# Patient Record
Sex: Male | Born: 1985
Health system: Southern US, Community
[De-identification: ages and names within clinical notes are randomized; demographics above are authoritative.]

## PROBLEM LIST (undated history)

## (undated) DIAGNOSIS — J45909 Unspecified asthma, uncomplicated: Secondary | ICD-10-CM

---

## 2019-07-13 ENCOUNTER — Inpatient Hospital Stay (HOSPITAL_COMMUNITY): Payer: No Typology Code available for payment source

## 2019-07-13 ENCOUNTER — Emergency Department (HOSPITAL_COMMUNITY): Payer: No Typology Code available for payment source

## 2019-07-13 ENCOUNTER — Encounter (HOSPITAL_COMMUNITY): Payer: Self-pay

## 2019-07-13 ENCOUNTER — Inpatient Hospital Stay (HOSPITAL_COMMUNITY)
Admission: EM | Admit: 2019-07-13 | Discharge: 2019-07-25 | DRG: 956 | Disposition: A | Discharge status: Rehabilitation Facility | Payer: No Typology Code available for payment source | Attending: Physician Assistant | Admitting: Physician Assistant

## 2019-07-13 ENCOUNTER — Encounter (HOSPITAL_COMMUNITY): Admission: EM | Disposition: A | Discharge status: Rehabilitation Facility | Payer: Self-pay | Source: Home / Self Care

## 2019-07-13 ENCOUNTER — Inpatient Hospital Stay (HOSPITAL_COMMUNITY): Payer: No Typology Code available for payment source | Admitting: Certified Registered Nurse Anesthetist

## 2019-07-13 DIAGNOSIS — S83104A Unspecified dislocation of right knee, initial encounter: Secondary | ICD-10-CM | POA: Diagnosis present

## 2019-07-13 DIAGNOSIS — S2232XA Fracture of one rib, left side, initial encounter for closed fracture: Secondary | ICD-10-CM | POA: Diagnosis present

## 2019-07-13 DIAGNOSIS — Z23 Encounter for immunization: Secondary | ICD-10-CM

## 2019-07-13 DIAGNOSIS — Z886 Allergy status to analgesic agent status: Secondary | ICD-10-CM | POA: Diagnosis not present

## 2019-07-13 DIAGNOSIS — R109 Unspecified abdominal pain: Secondary | ICD-10-CM | POA: Diagnosis not present

## 2019-07-13 DIAGNOSIS — Z20822 Contact with and (suspected) exposure to covid-19: Secondary | ICD-10-CM | POA: Diagnosis present

## 2019-07-13 DIAGNOSIS — G4700 Insomnia, unspecified: Secondary | ICD-10-CM | POA: Diagnosis present

## 2019-07-13 DIAGNOSIS — S27329A Contusion of lung, unspecified, initial encounter: Secondary | ICD-10-CM

## 2019-07-13 DIAGNOSIS — S32402A Unspecified fracture of left acetabulum, initial encounter for closed fracture: Secondary | ICD-10-CM | POA: Diagnosis present

## 2019-07-13 DIAGNOSIS — K5909 Other constipation: Secondary | ICD-10-CM | POA: Diagnosis not present

## 2019-07-13 DIAGNOSIS — R609 Edema, unspecified: Secondary | ICD-10-CM | POA: Diagnosis not present

## 2019-07-13 DIAGNOSIS — M238X1 Other internal derangements of right knee: Secondary | ICD-10-CM | POA: Diagnosis present

## 2019-07-13 DIAGNOSIS — Z91012 Allergy to eggs: Secondary | ICD-10-CM

## 2019-07-13 DIAGNOSIS — S82142A Displaced bicondylar fracture of left tibia, initial encounter for closed fracture: Secondary | ICD-10-CM | POA: Diagnosis present

## 2019-07-13 DIAGNOSIS — S52021B Displaced fracture of olecranon process without intraarticular extension of right ulna, initial encounter for open fracture type I or II: Secondary | ICD-10-CM | POA: Diagnosis present

## 2019-07-13 DIAGNOSIS — S83511A Sprain of anterior cruciate ligament of right knee, initial encounter: Secondary | ICD-10-CM | POA: Diagnosis present

## 2019-07-13 DIAGNOSIS — S72012A Unspecified intracapsular fracture of left femur, initial encounter for closed fracture: Secondary | ICD-10-CM | POA: Diagnosis present

## 2019-07-13 DIAGNOSIS — S8412XA Injury of peroneal nerve at lower leg level, left leg, initial encounter: Secondary | ICD-10-CM | POA: Diagnosis present

## 2019-07-13 DIAGNOSIS — T148XXA Other injury of unspecified body region, initial encounter: Secondary | ICD-10-CM

## 2019-07-13 DIAGNOSIS — D62 Acute posthemorrhagic anemia: Secondary | ICD-10-CM | POA: Diagnosis present

## 2019-07-13 DIAGNOSIS — Z419 Encounter for procedure for purposes other than remedying health state, unspecified: Secondary | ICD-10-CM

## 2019-07-13 DIAGNOSIS — T1490XA Injury, unspecified, initial encounter: Secondary | ICD-10-CM | POA: Diagnosis present

## 2019-07-13 DIAGNOSIS — Y92410 Unspecified street and highway as the place of occurrence of the external cause: Secondary | ICD-10-CM | POA: Diagnosis not present

## 2019-07-13 DIAGNOSIS — J45909 Unspecified asthma, uncomplicated: Secondary | ICD-10-CM | POA: Diagnosis present

## 2019-07-13 DIAGNOSIS — Y906 Blood alcohol level of 120-199 mg/100 ml: Secondary | ICD-10-CM | POA: Diagnosis present

## 2019-07-13 DIAGNOSIS — Z888 Allergy status to other drugs, medicaments and biological substances status: Secondary | ICD-10-CM

## 2019-07-13 DIAGNOSIS — R7303 Prediabetes: Secondary | ICD-10-CM | POA: Diagnosis present

## 2019-07-13 DIAGNOSIS — S022XXA Fracture of nasal bones, initial encounter for closed fracture: Secondary | ICD-10-CM | POA: Diagnosis present

## 2019-07-13 DIAGNOSIS — R739 Hyperglycemia, unspecified: Secondary | ICD-10-CM | POA: Diagnosis present

## 2019-07-13 DIAGNOSIS — F10129 Alcohol abuse with intoxication, unspecified: Secondary | ICD-10-CM | POA: Diagnosis present

## 2019-07-13 DIAGNOSIS — S27321A Contusion of lung, unilateral, initial encounter: Secondary | ICD-10-CM | POA: Diagnosis present

## 2019-07-13 DIAGNOSIS — S72302C Unspecified fracture of shaft of left femur, initial encounter for open fracture type IIIA, IIIB, or IIIC: Secondary | ICD-10-CM | POA: Diagnosis present

## 2019-07-13 DIAGNOSIS — R4182 Altered mental status, unspecified: Secondary | ICD-10-CM | POA: Diagnosis present

## 2019-07-13 DIAGNOSIS — R7401 Elevation of levels of liver transaminase levels: Secondary | ICD-10-CM | POA: Diagnosis present

## 2019-07-13 DIAGNOSIS — Z4659 Encounter for fitting and adjustment of other gastrointestinal appliance and device: Secondary | ICD-10-CM

## 2019-07-13 DIAGNOSIS — F17290 Nicotine dependence, other tobacco product, uncomplicated: Secondary | ICD-10-CM | POA: Diagnosis present

## 2019-07-13 HISTORY — PX: FEMUR IM NAIL: SHX1597

## 2019-07-13 HISTORY — DX: Unspecified asthma, uncomplicated: J45.909

## 2019-07-13 HISTORY — PX: PERCUTANEOUS PINNING: SHX2209

## 2019-07-13 HISTORY — PX: I & D EXTREMITY: SHX5045

## 2019-07-13 HISTORY — PX: ORIF ELBOW FRACTURE: SHX5031

## 2019-07-13 LAB — HEMOGLOBIN A1C
Hgb A1c MFr Bld: 5.9 % — ABNORMAL HIGH (ref 4.8–5.6)
Mean Plasma Glucose: 122.63 mg/dL

## 2019-07-13 LAB — COMPREHENSIVE METABOLIC PANEL
ALT: 151 U/L — ABNORMAL HIGH (ref 0–44)
AST: 218 U/L — ABNORMAL HIGH (ref 15–41)
Albumin: 4.1 g/dL (ref 3.5–5.0)
Alkaline Phosphatase: 55 U/L (ref 38–126)
Anion gap: 15 (ref 5–15)
BUN: 11 mg/dL (ref 6–20)
CO2: 20 mmol/L — ABNORMAL LOW (ref 22–32)
Calcium: 9 mg/dL (ref 8.9–10.3)
Chloride: 106 mmol/L (ref 98–111)
Creatinine, Ser: 1.37 mg/dL — ABNORMAL HIGH (ref 0.61–1.24)
GFR calc Af Amer: >60 mL/min (ref >60)
GFR calc non Af Amer: >60 mL/min (ref >60)
Glucose, Bld: 189 mg/dL — ABNORMAL HIGH (ref 70–99)
Potassium: 3.2 mmol/L — ABNORMAL LOW (ref 3.5–5.1)
Sodium: 141 mmol/L (ref 135–145)
Total Bilirubin: 0.3 mg/dL (ref 0.3–1.2)
Total Protein: 7.1 g/dL (ref 6.5–8.1)

## 2019-07-13 LAB — CBC
HCT: 44.9 % (ref 39.0–52.0)
HCT: 39.9 % (ref 39.0–52.0)
HCT: 27.2 % — ABNORMAL LOW (ref 39.0–52.0)
Hemoglobin: 15.1 g/dL (ref 13.0–17.0)
Hemoglobin: 12.8 g/dL — ABNORMAL LOW (ref 13.0–17.0)
Hemoglobin: 9.1 g/dL — ABNORMAL LOW (ref 13.0–17.0)
MCH: 31.3 pg (ref 26.0–34.0)
MCH: 30.5 pg (ref 26.0–34.0)
MCH: 31.1 pg (ref 26.0–34.0)
MCHC: 33.6 g/dL (ref 30.0–36.0)
MCHC: 32.1 g/dL (ref 30.0–36.0)
MCHC: 33.5 g/dL (ref 30.0–36.0)
MCV: 93 fL (ref 80.0–100.0)
MCV: 95.2 fL (ref 80.0–100.0)
MCV: 92.8 fL (ref 80.0–100.0)
Platelets: 296 10*3/uL (ref 150–400)
Platelets: 224 10*3/uL (ref 150–400)
Platelets: 173 10*3/uL (ref 150–400)
RBC: 4.83 MIL/uL (ref 4.22–5.81)
RBC: 4.19 MIL/uL — ABNORMAL LOW (ref 4.22–5.81)
RBC: 2.93 MIL/uL — ABNORMAL LOW (ref 4.22–5.81)
RDW: 12.1 % (ref 11.5–15.5)
RDW: 12.4 % (ref 11.5–15.5)
RDW: 12.5 % (ref 11.5–15.5)
WBC: 12.1 10*3/uL — ABNORMAL HIGH (ref 4.0–10.5)
WBC: 19.7 10*3/uL — ABNORMAL HIGH (ref 4.0–10.5)
WBC: 9.2 10*3/uL (ref 4.0–10.5)
nRBC: 0 % (ref 0.0–0.2)
nRBC: 0 % (ref 0.0–0.2)
nRBC: 0 % (ref 0.0–0.2)

## 2019-07-13 LAB — RESPIRATORY PANEL BY RT PCR (FLU A&B, COVID)
Influenza A by PCR: NEGATIVE
Influenza B by PCR: NEGATIVE
SARS Coronavirus 2 by RT PCR: NEGATIVE

## 2019-07-13 LAB — POCT I-STAT, CHEM 8
BUN: 11 mg/dL (ref 6–20)
Calcium, Ion: 1.05 mmol/L — ABNORMAL LOW (ref 1.15–1.40)
Chloride: 107 mmol/L (ref 98–111)
Creatinine, Ser: 0.8 mg/dL (ref 0.61–1.24)
Glucose, Bld: 159 mg/dL — ABNORMAL HIGH (ref 70–99)
HCT: 25 % — ABNORMAL LOW (ref 39.0–52.0)
Hemoglobin: 8.5 g/dL — ABNORMAL LOW (ref 13.0–17.0)
Potassium: 4.7 mmol/L (ref 3.5–5.1)
Sodium: 142 mmol/L (ref 135–145)
TCO2: 27 mmol/L (ref 22–32)

## 2019-07-13 LAB — POCT I-STAT 7, (LYTES, BLD GAS, ICA,H+H)
Acid-Base Excess: 0 mmol/L (ref 0.0–2.0)
Acid-Base Excess: 1 mmol/L (ref 0.0–2.0)
Acid-base deficit: 7 mmol/L — ABNORMAL HIGH (ref 0.0–2.0)
Bicarbonate: 20.3 mmol/L (ref 20.0–28.0)
Bicarbonate: 27.3 mmol/L (ref 20.0–28.0)
Bicarbonate: 27.1 mmol/L (ref 20.0–28.0)
Calcium, Ion: 1.15 mmol/L (ref 1.15–1.40)
Calcium, Ion: 1.12 mmol/L — ABNORMAL LOW (ref 1.15–1.40)
Calcium, Ion: 1.01 mmol/L — ABNORMAL LOW (ref 1.15–1.40)
HCT: 35 % — ABNORMAL LOW (ref 39.0–52.0)
HCT: 30 % — ABNORMAL LOW (ref 39.0–52.0)
HCT: 27 % — ABNORMAL LOW (ref 39.0–52.0)
Hemoglobin: 11.9 g/dL — ABNORMAL LOW (ref 13.0–17.0)
Hemoglobin: 10.2 g/dL — ABNORMAL LOW (ref 13.0–17.0)
Hemoglobin: 9.2 g/dL — ABNORMAL LOW (ref 13.0–17.0)
O2 Saturation: 100 %
O2 Saturation: 100 %
O2 Saturation: 100 %
Patient temperature: 96.3
Patient temperature: 36.5
Patient temperature: 36.5
Potassium: 3.6 mmol/L (ref 3.5–5.1)
Potassium: 4.3 mmol/L (ref 3.5–5.1)
Potassium: 4.5 mmol/L (ref 3.5–5.1)
Sodium: 142 mmol/L (ref 135–145)
Sodium: 143 mmol/L (ref 135–145)
Sodium: 143 mmol/L (ref 135–145)
TCO2: 22 mmol/L (ref 22–32)
TCO2: 29 mmol/L (ref 22–32)
TCO2: 29 mmol/L (ref 22–32)
pCO2 arterial: 45.8 mmHg (ref 32.0–48.0)
pCO2 arterial: 54 mmHg — ABNORMAL HIGH (ref 32.0–48.0)
pCO2 arterial: 51.6 mmHg — ABNORMAL HIGH (ref 32.0–48.0)
pH, Arterial: 7.247 — ABNORMAL LOW (ref 7.350–7.450)
pH, Arterial: 7.309 — ABNORMAL LOW (ref 7.350–7.450)
pH, Arterial: 7.327 — ABNORMAL LOW (ref 7.350–7.450)
pO2, Arterial: 528 mmHg — ABNORMAL HIGH (ref 83.0–108.0)
pO2, Arterial: 381 mmHg — ABNORMAL HIGH (ref 83.0–108.0)
pO2, Arterial: 315 mmHg — ABNORMAL HIGH (ref 83.0–108.0)

## 2019-07-13 LAB — URINALYSIS, ROUTINE W REFLEX MICROSCOPIC
Bacteria, UA: NONE SEEN
Bilirubin Urine: NEGATIVE
Glucose, UA: 50 mg/dL — AB
Ketones, ur: NEGATIVE mg/dL
Leukocytes,Ua: NEGATIVE
Nitrite: NEGATIVE
Protein, ur: NEGATIVE mg/dL
Specific Gravity, Urine: 1.017 (ref 1.005–1.030)
pH: 7 (ref 5.0–8.0)

## 2019-07-13 LAB — I-STAT CHEM 8, ED
BUN: 12 mg/dL (ref 6–20)
Calcium, Ion: 1 mmol/L — ABNORMAL LOW (ref 1.15–1.40)
Chloride: 105 mmol/L (ref 98–111)
Creatinine, Ser: 1.5 mg/dL — ABNORMAL HIGH (ref 0.61–1.24)
Glucose, Bld: 180 mg/dL — ABNORMAL HIGH (ref 70–99)
HCT: 45 % (ref 39.0–52.0)
Hemoglobin: 15.3 g/dL (ref 13.0–17.0)
Potassium: 3.2 mmol/L — ABNORMAL LOW (ref 3.5–5.1)
Sodium: 142 mmol/L (ref 135–145)
TCO2: 23 mmol/L (ref 22–32)

## 2019-07-13 LAB — BASIC METABOLIC PANEL
Anion gap: 12 (ref 5–15)
BUN: 11 mg/dL (ref 6–20)
CO2: 20 mmol/L — ABNORMAL LOW (ref 22–32)
Calcium: 7.7 mg/dL — ABNORMAL LOW (ref 8.9–10.3)
Chloride: 112 mmol/L — ABNORMAL HIGH (ref 98–111)
Creatinine, Ser: 1.04 mg/dL (ref 0.61–1.24)
GFR calc Af Amer: >60 mL/min (ref >60)
GFR calc non Af Amer: >60 mL/min (ref >60)
Glucose, Bld: 166 mg/dL — ABNORMAL HIGH (ref 70–99)
Potassium: 3.6 mmol/L (ref 3.5–5.1)
Sodium: 144 mmol/L (ref 135–145)

## 2019-07-13 LAB — MRSA PCR SCREENING: MRSA by PCR: NEGATIVE

## 2019-07-13 LAB — ABO/RH: ABO/RH(D): B POS

## 2019-07-13 LAB — HIV ANTIBODY (ROUTINE TESTING W REFLEX): HIV Screen 4th Generation wRfx: NONREACTIVE

## 2019-07-13 LAB — GLUCOSE, CAPILLARY
Glucose-Capillary: 165 mg/dL — ABNORMAL HIGH (ref 70–99)
Glucose-Capillary: 111 mg/dL — ABNORMAL HIGH (ref 70–99)

## 2019-07-13 LAB — LACTIC ACID, PLASMA
Lactic Acid, Venous: 4.3 mmol/L (ref 0.5–1.9)
Lactic Acid, Venous: 1.4 mmol/L (ref 0.5–1.9)

## 2019-07-13 LAB — SAMPLE TO BLOOD BANK

## 2019-07-13 LAB — PROTIME-INR
INR: 1.1 (ref 0.8–1.2)
Prothrombin Time: 14.3 seconds (ref 11.4–15.2)

## 2019-07-13 LAB — ETHANOL: Alcohol, Ethyl (B): 137 mg/dL — ABNORMAL HIGH (ref <10)

## 2019-07-13 SURGERY — IRRIGATION AND DEBRIDEMENT EXTREMITY
Anesthesia: General | Site: Elbow | Laterality: Right

## 2019-07-13 MED ORDER — ROCURONIUM BROMIDE 10 MG/ML (PF) SYRINGE
PREFILLED_SYRINGE | INTRAVENOUS | Status: AC
  Filled 2019-07-13: qty 10

## 2019-07-13 MED ORDER — ONDANSETRON HCL 4 MG/2ML IJ SOLN
INTRAMUSCULAR | Status: DC | PRN
  Administered 2019-07-13: 4 mg via INTRAVENOUS

## 2019-07-13 MED ORDER — SODIUM CHLORIDE 0.9 % IV SOLN
INTRAVENOUS | Status: AC | PRN
  Administered 2019-07-13 (×3): 1000 mL via INTRAVENOUS

## 2019-07-13 MED ORDER — ROCURONIUM BROMIDE 10 MG/ML (PF) SYRINGE
PREFILLED_SYRINGE | INTRAVENOUS | Status: DC | PRN
  Administered 2019-07-13 (×2): 50 mg via INTRAVENOUS
  Administered 2019-07-13: 40 mg via INTRAVENOUS
  Administered 2019-07-13: 60 mg via INTRAVENOUS
  Administered 2019-07-13: 20 mg via INTRAVENOUS

## 2019-07-13 MED ORDER — FENTANYL CITRATE (PF) 100 MCG/2ML IJ SOLN
INTRAMUSCULAR | Status: AC
  Filled 2019-07-13: qty 2

## 2019-07-13 MED ORDER — DEXTROSE-NACL 5-0.9 % IV SOLN: INTRAVENOUS | Status: DC

## 2019-07-13 MED ORDER — CEFAZOLIN SODIUM 1 G IJ SOLR
INTRAMUSCULAR | Status: AC
  Filled 2019-07-13: qty 20

## 2019-07-13 MED ORDER — KETAMINE HCL 50 MG/5ML IJ SOSY
PREFILLED_SYRINGE | INTRAMUSCULAR | Status: AC
  Filled 2019-07-13: qty 5

## 2019-07-13 MED ORDER — SODIUM CHLORIDE 0.9 % IV SOLN: INTRAVENOUS | Status: DC | PRN

## 2019-07-13 MED ORDER — INSULIN ASPART 100 UNIT/ML ~~LOC~~ SOLN
0.0000 [IU] | SUBCUTANEOUS | Status: DC
  Administered 2019-07-13: 3 [IU] via SUBCUTANEOUS
  Administered 2019-07-14 (×3): 2 [IU] via SUBCUTANEOUS
  Administered 2019-07-14 – 2019-07-15 (×2): 3 [IU] via SUBCUTANEOUS
  Administered 2019-07-15 (×4): 2 [IU] via SUBCUTANEOUS

## 2019-07-13 MED ORDER — TETANUS-DIPHTH-ACELL PERTUSSIS 5-2.5-18.5 LF-MCG/0.5 IM SUSP
0.5000 mL | Freq: Once | INTRAMUSCULAR | Status: AC
  Administered 2019-07-13: 0.5 mL via INTRAMUSCULAR
  Filled 2019-07-13: qty 0.5

## 2019-07-13 MED ORDER — ORAL CARE MOUTH RINSE
15.0000 mL | OROMUCOSAL | Status: DC
  Administered 2019-07-13 – 2019-07-14 (×8): 15 mL via OROMUCOSAL

## 2019-07-13 MED ORDER — PROPOFOL 10 MG/ML IV BOLUS
INTRAVENOUS | Status: AC
  Filled 2019-07-13: qty 40

## 2019-07-13 MED ORDER — CEFAZOLIN SODIUM-DEXTROSE 2-4 GM/100ML-% IV SOLN
2.0000 g | INTRAVENOUS | Status: AC
  Administered 2019-07-13: 2 g via INTRAVENOUS
  Filled 2019-07-13: qty 100

## 2019-07-13 MED ORDER — PHENYLEPHRINE 40 MCG/ML (10ML) SYRINGE FOR IV PUSH (FOR BLOOD PRESSURE SUPPORT)
PREFILLED_SYRINGE | INTRAVENOUS | Status: AC
  Filled 2019-07-13: qty 10

## 2019-07-13 MED ORDER — VECURONIUM BROMIDE 10 MG IV SOLR
INTRAVENOUS | Status: AC | PRN
  Administered 2019-07-13: 10 mg via INTRAVENOUS

## 2019-07-13 MED ORDER — CEFAZOLIN SODIUM-DEXTROSE 2-4 GM/100ML-% IV SOLN
2.0000 g | Freq: Three times a day (TID) | INTRAVENOUS | Status: AC
  Administered 2019-07-14 (×3): 2 g via INTRAVENOUS
  Filled 2019-07-13 (×3): qty 100

## 2019-07-13 MED ORDER — SODIUM CHLORIDE 0.9 % IR SOLN
Status: DC | PRN
  Administered 2019-07-13 (×4): 3000 mL

## 2019-07-13 MED ORDER — PROPOFOL 1000 MG/100ML IV EMUL
INTRAVENOUS | Status: AC
  Administered 2019-07-13: 15 ug/kg/min
  Filled 2019-07-13: qty 100

## 2019-07-13 MED ORDER — ETOMIDATE 2 MG/ML IV SOLN
INTRAVENOUS | Status: AC | PRN
  Administered 2019-07-13: 20 mg via INTRAVENOUS

## 2019-07-13 MED ORDER — MIDAZOLAM HCL 2 MG/2ML IJ SOLN
INTRAMUSCULAR | Status: DC | PRN
  Administered 2019-07-13: 2 mg via INTRAVENOUS

## 2019-07-13 MED ORDER — CEFAZOLIN SODIUM-DEXTROSE 2-4 GM/100ML-% IV SOLN: 2.0000 g | Freq: Three times a day (TID) | INTRAVENOUS | Status: DC

## 2019-07-13 MED ORDER — LACTATED RINGERS IV SOLN
INTRAVENOUS | Status: DC | PRN
Start: 2019-07-13 — End: 2019-07-13

## 2019-07-13 MED ORDER — PHENYLEPHRINE HCL-NACL 10-0.9 MG/250ML-% IV SOLN
INTRAVENOUS | Status: DC | PRN
  Administered 2019-07-13: 25 ug/min via INTRAVENOUS

## 2019-07-13 MED ORDER — PROPOFOL 1000 MG/100ML IV EMUL
5.0000 ug/kg/min | INTRAVENOUS | Status: DC
  Administered 2019-07-13: 5 ug/kg/min via INTRAVENOUS
  Administered 2019-07-14: 35 ug/kg/min via INTRAVENOUS
  Administered 2019-07-14: 40 ug/kg/min via INTRAVENOUS
  Filled 2019-07-13 (×3): qty 100

## 2019-07-13 MED ORDER — FENTANYL 2500MCG IN NS 250ML (10MCG/ML) PREMIX INFUSION
0.0000 ug/h | INTRAVENOUS | Status: DC
  Administered 2019-07-13: 100 ug/h via INTRAVENOUS
  Administered 2019-07-13: 200 ug/h via INTRAVENOUS
  Administered 2019-07-13: 350 ug/h via INTRAVENOUS
  Administered 2019-07-14: 300 ug/h via INTRAVENOUS
  Filled 2019-07-13 (×4): qty 250

## 2019-07-13 MED ORDER — IOHEXOL 300 MG/ML  SOLN
100.0000 mL | Freq: Once | INTRAMUSCULAR | Status: AC | PRN
  Administered 2019-07-13: 100 mL via INTRAVENOUS

## 2019-07-13 MED ORDER — PROPOFOL 10 MG/ML IV BOLUS
INTRAVENOUS | Status: DC | PRN
  Administered 2019-07-13: 40 mg via INTRAVENOUS
  Administered 2019-07-13: 30 mg via INTRAVENOUS

## 2019-07-13 MED ORDER — ALBUMIN HUMAN 5 % IV SOLN: INTRAVENOUS | Status: DC | PRN

## 2019-07-13 MED ORDER — VECURONIUM BROMIDE 10 MG IV SOLR
INTRAVENOUS | Status: AC
  Filled 2019-07-13: qty 10

## 2019-07-13 MED ORDER — CHLORHEXIDINE GLUCONATE 0.12% ORAL RINSE (MEDLINE KIT)
15.0000 mL | Freq: Two times a day (BID) | OROMUCOSAL | Status: DC
  Administered 2019-07-13 – 2019-07-14 (×2): 15 mL via OROMUCOSAL

## 2019-07-13 MED ORDER — FENTANYL CITRATE (PF) 100 MCG/2ML IJ SOLN
INTRAMUSCULAR | Status: DC | PRN
  Administered 2019-07-13 (×3): 50 ug via INTRAVENOUS
  Administered 2019-07-13: 100 ug via INTRAVENOUS

## 2019-07-13 MED ORDER — ONDANSETRON 4 MG PO TBDP: 4.0000 mg | ORAL_TABLET | Freq: Four times a day (QID) | ORAL | Status: DC | PRN

## 2019-07-13 MED ORDER — FENTANYL CITRATE (PF) 100 MCG/2ML IJ SOLN
INTRAMUSCULAR | Status: AC
  Administered 2019-07-13: 100 ug
  Filled 2019-07-13: qty 2

## 2019-07-13 MED ORDER — MIDAZOLAM HCL 2 MG/2ML IJ SOLN
INTRAMUSCULAR | Status: AC
  Filled 2019-07-13: qty 2

## 2019-07-13 MED ORDER — ENOXAPARIN SODIUM 40 MG/0.4ML ~~LOC~~ SOLN
40.0000 mg | SUBCUTANEOUS | Status: DC
  Administered 2019-07-14: 40 mg via SUBCUTANEOUS
  Filled 2019-07-13 (×2): qty 0.4

## 2019-07-13 MED ORDER — MIDAZOLAM 50MG/50ML (1MG/ML) PREMIX INFUSION
0.5000 mg/h | INTRAVENOUS | Status: DC
  Administered 2019-07-13: 0.5 mg/h via INTRAVENOUS
  Filled 2019-07-13: qty 50

## 2019-07-13 MED ORDER — HYDRALAZINE HCL 20 MG/ML IJ SOLN
10.0000 mg | INTRAMUSCULAR | Status: DC | PRN
  Administered 2019-07-14: 10 mg via INTRAVENOUS
  Filled 2019-07-13 (×2): qty 1

## 2019-07-13 MED ORDER — ONDANSETRON HCL 4 MG/2ML IJ SOLN
INTRAMUSCULAR | Status: AC
  Filled 2019-07-13: qty 2

## 2019-07-13 MED ORDER — CEFAZOLIN SODIUM-DEXTROSE 2-4 GM/100ML-% IV SOLN
2.0000 g | Freq: Three times a day (TID) | INTRAVENOUS | Status: DC
  Administered 2019-07-13: 2 g via INTRAVENOUS
  Filled 2019-07-13: qty 100

## 2019-07-13 MED ORDER — MIDAZOLAM HCL 2 MG/2ML IJ SOLN
1.0000 mg | INTRAMUSCULAR | Status: DC | PRN
  Administered 2019-07-13: 2 mg via INTRAVENOUS
  Filled 2019-07-13: qty 2

## 2019-07-13 MED ORDER — CHLORHEXIDINE GLUCONATE CLOTH 2 % EX PADS
6.0000 | MEDICATED_PAD | Freq: Every day | CUTANEOUS | Status: DC
  Administered 2019-07-16 – 2019-07-23 (×7): 6 via TOPICAL

## 2019-07-13 MED ORDER — ONDANSETRON HCL 4 MG/2ML IJ SOLN: 4.0000 mg | Freq: Four times a day (QID) | INTRAMUSCULAR | Status: DC | PRN

## 2019-07-13 MED ORDER — SUCCINYLCHOLINE CHLORIDE 20 MG/ML IJ SOLN
INTRAMUSCULAR | Status: AC | PRN
  Administered 2019-07-13: 100 mg via INTRAVENOUS

## 2019-07-13 MED ORDER — 0.9 % SODIUM CHLORIDE (POUR BTL) OPTIME
TOPICAL | Status: DC | PRN
  Administered 2019-07-13: 1000 mL

## 2019-07-13 MED ORDER — PHENYLEPHRINE HCL (PRESSORS) 10 MG/ML IV SOLN
INTRAVENOUS | Status: DC | PRN
  Administered 2019-07-13 (×2): 80 ug via INTRAVENOUS

## 2019-07-13 MED ORDER — CEFAZOLIN SODIUM-DEXTROSE 2-3 GM-%(50ML) IV SOLR
INTRAVENOUS | Status: DC | PRN
Start: 2019-07-13 — End: 2019-07-13
  Administered 2019-07-13: 2 g via INTRAVENOUS

## 2019-07-13 MED ORDER — ARTIFICIAL TEARS OPHTHALMIC OINT
TOPICAL_OINTMENT | OPHTHALMIC | Status: AC
  Filled 2019-07-13: qty 7

## 2019-07-13 MED ORDER — FENTANYL CITRATE (PF) 250 MCG/5ML IJ SOLN
INTRAMUSCULAR | Status: AC
  Filled 2019-07-13: qty 5

## 2019-07-13 SURGICAL SUPPLY — 130 items
BANDAGE ESMARK 6X9 LF (GAUZE/BANDAGES/DRESSINGS) IMPLANT
BENZOIN TINCTURE PRP APPL 2/3 (GAUZE/BANDAGES/DRESSINGS) IMPLANT
BIT DRILL 2.0 LNG QUCK RELEASE (BIT) ×3 IMPLANT
BIT DRILL 2.3 QUICK RELEASE (BIT) ×3 IMPLANT
BIT DRILL 2.8 QUICK RELEASE (BIT) ×3 IMPLANT
BIT DRILL 4.8X200 CANN (BIT) ×5 IMPLANT
BIT DRILL CALIBRATED 4.3MMX365 (DRILL) ×3 IMPLANT
BIT DRILL CROWE PNT TWST 4.5MM (DRILL) ×3 IMPLANT
BLADE AVERAGE 25MMX9MM (BLADE) ×1
BLADE AVERAGE 25X9 (BLADE) ×4 IMPLANT
BLADE CLIPPER SURG (BLADE) ×5 IMPLANT
BLADE SURG 10 STRL SS (BLADE) IMPLANT
BNDG COHESIVE 4X5 TAN STRL (GAUZE/BANDAGES/DRESSINGS) ×5 IMPLANT
BNDG ELASTIC 3X5.8 VLCR STR LF (GAUZE/BANDAGES/DRESSINGS) ×5 IMPLANT
BNDG ELASTIC 4X5.8 VLCR STR LF (GAUZE/BANDAGES/DRESSINGS) ×5 IMPLANT
BNDG ELASTIC 6X15 VLCR STRL LF (GAUZE/BANDAGES/DRESSINGS) ×5 IMPLANT
BNDG ELASTIC 6X5.8 VLCR STR LF (GAUZE/BANDAGES/DRESSINGS) ×5 IMPLANT
BNDG ESMARK 4X9 LF (GAUZE/BANDAGES/DRESSINGS) ×5 IMPLANT
BNDG ESMARK 6X9 LF (GAUZE/BANDAGES/DRESSINGS)
BNDG GAUZE ELAST 4 BULKY (GAUZE/BANDAGES/DRESSINGS) ×5 IMPLANT
BRUSH SCRUB EZ PLAIN DRY (MISCELLANEOUS) ×10 IMPLANT
CLEANER TIP ELECTROSURG 2X2 (MISCELLANEOUS) ×5 IMPLANT
CLOSURE WOUND 1/2 X4 (GAUZE/BANDAGES/DRESSINGS)
COVER MAYO STAND STRL (DRAPES) ×5 IMPLANT
COVER SURGICAL LIGHT HANDLE (MISCELLANEOUS) ×10 IMPLANT
COVER WAND RF STERILE (DRAPES) ×5 IMPLANT
CUFF TOURN SGL QUICK 18X4 (TOURNIQUET CUFF) IMPLANT
CUFF TOURN SGL QUICK 24 (TOURNIQUET CUFF)
CUFF TRNQT CYL 24X4X16.5-23 (TOURNIQUET CUFF) IMPLANT
DECANTER SPIKE VIAL GLASS SM (MISCELLANEOUS) IMPLANT
DRAPE C-ARM 42X72 X-RAY (DRAPES) ×5 IMPLANT
DRAPE C-ARMOR (DRAPES) ×5 IMPLANT
DRAPE IMP U-DRAPE 54X76 (DRAPES) ×5 IMPLANT
DRAPE INCISE IOBAN 66X45 STRL (DRAPES) ×5 IMPLANT
DRAPE ORTHO SPLIT 77X108 STRL (DRAPES) ×4
DRAPE SURG ORHT 6 SPLT 77X108 (DRAPES) ×6 IMPLANT
DRAPE U-SHAPE 47X51 STRL (DRAPES) ×5 IMPLANT
DRILL 2.0 LNG QUICK RELEASE (BIT) ×5
DRILL 2.3 QUICK RELEASE (BIT) ×5
DRILL 2.8 QUICK RELEASE (BIT) ×5
DRILL CALIBRATED 4.3MMX365 (DRILL) ×5
DRILL CROWE POINT TWIST 4.5MM (DRILL) ×5
DRSG ADAPTIC 3X8 NADH LF (GAUZE/BANDAGES/DRESSINGS) ×5 IMPLANT
DRSG EMULSION OIL 3X3 NADH (GAUZE/BANDAGES/DRESSINGS) IMPLANT
DRSG MEPITEL 4X7.2 (GAUZE/BANDAGES/DRESSINGS) ×10 IMPLANT
ELECT REM PT RETURN 9FT ADLT (ELECTROSURGICAL) ×5
ELECTRODE REM PT RTRN 9FT ADLT (ELECTROSURGICAL) ×3 IMPLANT
EVACUATOR 1/8 PVC DRAIN (DRAIN) IMPLANT
FACESHIELD WRAPAROUND (MASK) IMPLANT
GAUZE SPONGE 4X4 12PLY STRL (GAUZE/BANDAGES/DRESSINGS) ×5 IMPLANT
GAUZE SPONGE 4X4 12PLY STRL LF (GAUZE/BANDAGES/DRESSINGS) ×5 IMPLANT
GAUZE XEROFORM 1X8 LF (GAUZE/BANDAGES/DRESSINGS) ×5 IMPLANT
GAUZE XEROFORM 5X9 LF (GAUZE/BANDAGES/DRESSINGS) ×5 IMPLANT
GLOVE BIO SURGEON STRL SZ7.5 (GLOVE) IMPLANT
GLOVE BIO SURGEON STRL SZ8 (GLOVE) ×5 IMPLANT
GLOVE BIOGEL PI IND STRL 7.5 (GLOVE) IMPLANT
GLOVE BIOGEL PI IND STRL 8 (GLOVE) ×3 IMPLANT
GLOVE BIOGEL PI IND STRL 9 (GLOVE) ×3 IMPLANT
GLOVE BIOGEL PI INDICATOR 7.5 (GLOVE)
GLOVE BIOGEL PI INDICATOR 8 (GLOVE) ×2
GLOVE BIOGEL PI INDICATOR 9 (GLOVE) ×2
GOWN STRL REUS W/ TWL LRG LVL3 (GOWN DISPOSABLE) ×6 IMPLANT
GOWN STRL REUS W/ TWL XL LVL3 (GOWN DISPOSABLE) ×3 IMPLANT
GOWN STRL REUS W/TWL LRG LVL3 (GOWN DISPOSABLE) ×4
GOWN STRL REUS W/TWL XL LVL3 (GOWN DISPOSABLE) ×2
GUIDEPIN 3.2X17.5 THRD DISP (PIN) ×5 IMPLANT
GUIDEWIRE BEAD TIP (WIRE) ×5 IMPLANT
GUIDEWIRE ORTH 6X062XTROC NS (WIRE) ×6 IMPLANT
GUIDEWIRE ORTHO 2.0X9 ST (WIRE) ×10 IMPLANT
HANDPIECE INTERPULSE COAX TIP (DISPOSABLE)
IMMOBILIZER KNEE 22 UNIV (SOFTGOODS) ×10 IMPLANT
K-WIRE .062 (WIRE) ×4
KIT BASIN OR (CUSTOM PROCEDURE TRAY) ×5 IMPLANT
KIT TURNOVER KIT B (KITS) ×5 IMPLANT
MANIFOLD NEPTUNE II (INSTRUMENTS) ×5 IMPLANT
NAIL FEM RETRO 10.5X400 (Nail) ×5 IMPLANT
NS IRRIG 1000ML POUR BTL (IV SOLUTION) ×5 IMPLANT
PACK ORTHO EXTREMITY (CUSTOM PROCEDURE TRAY) ×5 IMPLANT
PACK UNIVERSAL I (CUSTOM PROCEDURE TRAY) ×5 IMPLANT
PAD ABD 8X10 STRL (GAUZE/BANDAGES/DRESSINGS) ×10 IMPLANT
PAD ARMBOARD 7.5X6 YLW CONV (MISCELLANEOUS) ×10 IMPLANT
PAD CAST 4YDX4 CTTN HI CHSV (CAST SUPPLIES) ×3 IMPLANT
PADDING CAST COTTON 4X4 STRL (CAST SUPPLIES) ×2
PADDING CAST COTTON 6X4 STRL (CAST SUPPLIES) ×5 IMPLANT
PIN GUIDE DRILL TIP 2.8X300 (DRILL) ×20 IMPLANT
PLATE OLECRANON 5 HOLE (Plate) ×5 IMPLANT
SCREW 8.0X80MMX16 (Screw) ×5 IMPLANT
SCREW CANN 8.0X70 16 THRD (Screw) ×3 IMPLANT
SCREW CANNULATED 8.0X70MM (Screw) ×5 IMPLANT
SCREW CORT TI DBL LEAD 5X30 (Screw) ×5 IMPLANT
SCREW CORT TI DBL LEAD 5X34 (Screw) ×5 IMPLANT
SCREW CORT TI DBL LEAD 5X56 (Screw) ×5 IMPLANT
SCREW CORT TI DBL LEAD 5X75 (Screw) ×5 IMPLANT
SCREW HEX LOCK 3.5X20MM (Screw) ×5 IMPLANT
SCREW HEX NON LOCK 3.0X50MM (Screw) ×5 IMPLANT
SCREW LOCK 12X2.7X HEXALOBE (Screw) ×3 IMPLANT
SCREW LOCK 18X2.7X HEXALOBE (Screw) ×3 IMPLANT
SCREW LOCKING 2.7X12MM (Screw) ×2 IMPLANT
SCREW LOCKING 2.7X18MM (Screw) ×2 IMPLANT
SCREW NON LOCKING HEX 3.5X18MM (Screw) ×5 IMPLANT
SCREW PARTIAL THREAD 8.0X90MM (Screw) ×5 IMPLANT
SET HNDPC FAN SPRY TIP SCT (DISPOSABLE) IMPLANT
SOL PREP POV-IOD 4OZ 10% (MISCELLANEOUS) ×5 IMPLANT
SOL PREP PROV IODINE SCRUB 4OZ (MISCELLANEOUS) ×5 IMPLANT
SPONGE LAP 18X18 RF (DISPOSABLE) ×10 IMPLANT
STAPLER VISISTAT 35W (STAPLE) ×5 IMPLANT
STOCKINETTE IMPERVIOUS 9X36 MD (GAUZE/BANDAGES/DRESSINGS) IMPLANT
STOCKINETTE IMPERVIOUS LG (DRAPES) ×5 IMPLANT
STRIP CLOSURE SKIN 1/2X4 (GAUZE/BANDAGES/DRESSINGS) IMPLANT
SUCTION FRAZIER HANDLE 10FR (MISCELLANEOUS) ×2
SUCTION TUBE FRAZIER 10FR DISP (MISCELLANEOUS) ×3 IMPLANT
SUT ETHILON 2 0 PSLX (SUTURE) ×10 IMPLANT
SUT PDS AB 0 CT 36 (SUTURE) ×10 IMPLANT
SUT PDS AB 2-0 CT1 27 (SUTURE) ×10 IMPLANT
SUT PROLENE 3 0 PS 2 (SUTURE) ×10 IMPLANT
SUT VIC AB 0 CT1 27 (SUTURE) ×8
SUT VIC AB 0 CT1 27XBRD ANBCTR (SUTURE) ×12 IMPLANT
SUT VIC AB 2-0 CT1 27 (SUTURE) ×4
SUT VIC AB 2-0 CT1 TAPERPNT 27 (SUTURE) ×6 IMPLANT
SUT VIC AB 2-0 CT3 27 (SUTURE) IMPLANT
SYR CONTROL 10ML LL (SYRINGE) ×5 IMPLANT
TOWEL GREEN STERILE (TOWEL DISPOSABLE) ×10 IMPLANT
TOWEL GREEN STERILE FF (TOWEL DISPOSABLE) ×5 IMPLANT
TUBE CONNECTING 12'X1/4 (SUCTIONS) ×1
TUBE CONNECTING 12X1/4 (SUCTIONS) ×4 IMPLANT
UNDERPAD 30X30 (UNDERPADS AND DIAPERS) ×5 IMPLANT
WASHER 8.0 (Orthopedic Implant) ×6 IMPLANT
WASHER CANN FLAT 8 (Orthopedic Implant) ×9 IMPLANT
WATER STERILE IRR 1000ML POUR (IV SOLUTION) ×5 IMPLANT
YANKAUER SUCT BULB TIP NO VENT (SUCTIONS) ×5 IMPLANT
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
## 2019-07-13 NOTE — Transfer of Care (Signed)
Immediate Anesthesia Transfer of Care Note  Patient: Tristan White  Procedure(s) Performed: IRRIGATION AND DEBRIDEMENT EXTREMITY Left thigh and Right elbow (Bilateral ) PERCUTANEOUS PINNING EXTREMITY Left femoral neck (Left ) INTRAMEDULLARY (IM) RETROGRADE FEMORAL NAILING Left femur (Left ) OPEN REDUCTION INTERNAL FIXATION (ORIF) ELBOW/OLECRANON FRACTURE (Right Elbow)  Patient Location: ICU  Anesthesia Type:General  Level of Consciousness: sedated and Patient remains intubated per anesthesia plan  Airway & Oxygen Therapy: Patient remains intubated per anesthesia plan and Patient placed on Ventilator (see vital sign flow sheet for setting)  Post-op Assessment: Report given to RN and Post -op Vital signs reviewed and stable  Post vital signs: Reviewed and stable  Last Vitals:  Vitals Value Taken Time  BP 135/82 07/13/19 2015  Temp    Pulse 96 07/13/19 2018  Resp 18 07/13/19 2018  SpO2 100 % 07/13/19 2018  Vitals shown include unvalidated device data.  Last Pain:  Vitals:   07/13/19 0800  TempSrc: Axillary  PainSc:          Complications: No apparent anesthesia complications
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
## 2019-07-13 NOTE — Progress Notes (Signed)
25 ml of 50 mg/50 ml of Versed wasted in sink. Witness by Lily Peer RN

## 2019-07-13 NOTE — Progress Notes (Signed)
Assisted tele visit to patient with family member.  Lorain Keast M, RN   
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
## 2019-07-13 NOTE — ED Provider Notes (Signed)
..  Laceration Repair  Date/Time: 07/13/2019 4:04 AM Performed by: Dierdre Forth, PA-C Authorized by: Dierdre Forth, PA-C   Consent:    Consent obtained:  Emergent situation (pt unresponsive and intubated) Universal protocol:    Patient states understanding of procedure being performed: pt intubated.     Relevant documents present and verified: yes     Test results available and properly labeled: yes     Imaging studies available: no     Required blood products, implants, devices, and special equipment available: yes     Site/side marked: yes     Immediately prior to procedure, a time out was called: yes     Patient identity confirmed:  Arm band Laceration details:    Location:  Shoulder/arm   Shoulder/arm location:  R elbow   Length (cm):  0.5 Repair type:    Repair type:  Simple Pre-procedure details:    Preparation:  Patient was prepped and draped in usual sterile fashion Exploration:    Hemostasis achieved with:  Direct pressure   Wound exploration comment:  Wound probed - unable to visualize the base of the wound given small size Treatment:    Area cleansed with:  Saline   Amount of cleaning:  Standard   Irrigation solution:  Sterile water   Irrigation method:  Syringe Skin repair:    Repair method:  Staples   Number of staples:  1 Approximation:    Approximation:  Close Post-procedure details:    Dressing:  Open (no dressing)   Patient tolerance of procedure:  Tolerated well, no immediate complications     Tristan White 07/13/19 0406    Tristan Crease, MD 07/13/19 [phone]

## 2019-07-13 NOTE — Progress Notes (Signed)
Orthopedic Tech Progress Note Patient Details:  Tristan White [birth year] [id number]  Ortho Devices Type of Ortho Device: Long arm splint Ortho Device/Splint Location: RUE Ortho Device/Splint Interventions: Application   Post Interventions Patient Tolerated: Well   Genelle Bal Salif Tay 07/13/2019, 4:56 AM

## 2019-07-13 NOTE — Anesthesia Preprocedure Evaluation (Addendum)
Anesthesia Evaluation  Patient identified by MRN, date of birth, ID band Patient unresponsive    Reviewed: Allergy & Precautions, Patient's Chart, lab work & pertinent test results, Unable to perform ROS - Chart review onlyPreop documentation limited or incomplete due to emergent nature of procedure.  Airway Mallampati: Intubated       Dental   Pulmonary asthma ,    Pulmonary exam normal        Cardiovascular negative cardio ROS Normal cardiovascular exam     Neuro/Psych negative neurological ROS  negative psych ROS   GI/Hepatic negative GI ROS, Neg liver ROS,   Endo/Other  negative endocrine ROS  Renal/GU negative Renal ROS     Musculoskeletal negative musculoskeletal ROS (+)   Abdominal   Peds  Hematology negative hematology ROS (+)   Anesthesia Other Findings   Reproductive/Obstetrics                            Anesthesia Physical Anesthesia Plan  ASA: II and emergent  Anesthesia Plan: General   Post-op Pain Management:    Induction: Inhalational  PONV Risk Score and Plan: 3 and Ondansetron, Midazolam and Treatment may vary due to age or medical condition  Airway Management Planned: Oral ETT  Additional Equipment: None  Intra-op Plan:   Post-operative Plan: Post-operative intubation/ventilation  Informed Consent:     History available from chart only and Only emergency history available  Plan Discussed with: CRNA  Anesthesia Plan Comments:        Anesthesia Quick Evaluation
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
## 2019-07-13 NOTE — Brief Op Note (Signed)
07/13/2019  11:08 PM  PATIENT:  Tristan White  34 y.o. male  PRE-OPERATIVE DIAGNOSIS:  open Right olecranon fracture, open left femoral shaft fracture, left femoral neck fracture  POST-OPERATIVE DIAGNOSIS:  open Right olecranon fracture, open left femoral shaft fracture, left femoral neck fracture  PROCEDURE:  Procedure(s): 1. IRRIGATION AND DEBRIDEMENT OPEN LEFT FEMORAL SHAFT FRACTURE 2. INTRAMEDULLARY NAILING OF THE LEFT FEMUR WITH BIOMET [place] 10.5 X 400 MM STATICALLY LOCKED NAIL 3. OPEN REPAIR OF LEFT FEMORAL NECK SUBCAPITAL FRACTURE 4. IRRIGATION AND DEBRIDEMENT OF OPEN RIGHT OLECRANON FRACTURE 5. OPEN REDUCTION INTERNAL FIXATION (ORIF) OLECRANON FRACTURE (Right) 6. APPLICATION OF BILATERAL LOWER EXTREMITY COMPRESSIVE DRESSINGS AND KNEE IMMIBOLIZERS FOR BILATERAL TIBIAL PLATEAU FRACTURES  SURGEON:  Surgeon(s) and Role:    Myrene Galas, MD - Primary  PHYSICIAN ASSISTANT: Montez Morita, PA-C; ANGELA HALL, RNFA  ANESTHESIA:   general  EBL:  425 mL   BLOOD ADMINISTERED:none  DRAINS: none   LOCAL MEDICATIONS USED:  NONE  SPECIMEN:  No Specimen  DISPOSITION OF SPECIMEN:  N/A  COUNTS:  YES  TOURNIQUET:  * No tourniquets in log *  DICTATION: .Other Dictation: Dictation Number [id number]  PLAN OF CARE: Admit to inpatient   PATIENT DISPOSITION:  ICU - intubated and hemodynamically stable.   Delay start of Pharmacological VTE agent (>24hrs) due to surgical blood loss or risk of bleeding: no

## 2019-07-13 NOTE — Anesthesia Procedure Notes (Signed)
Arterial Line Insertion Start/End4/24/2021 3:15 PM, 07/13/2019 3:20 PM Performed by: Dairl Ponder, CRNA, CRNA  Patient location: OR. Preanesthetic checklist: patient identified, IV checked, monitors and equipment checked and pre-op evaluation Patient sedated radial was placed Catheter size: 20 G Hand hygiene performed  and maximum sterile barriers used   Attempts: 2 Procedure performed without using ultrasound guided technique. Following insertion, dressing applied and Biopatch. Post procedure assessment: normal  Patient tolerated the procedure well with no immediate complications.

## 2019-07-13 NOTE — Anesthesia Postprocedure Evaluation (Signed)
Anesthesia Post Note  Patient: Grady Mohabir Teters  Procedure(s) Performed: IRRIGATION AND DEBRIDEMENT EXTREMITY Left thigh and Right elbow (Bilateral ) PERCUTANEOUS PINNING EXTREMITY Left femoral neck (Left ) INTRAMEDULLARY (IM) RETROGRADE FEMORAL NAILING Left femur (Left ) OPEN REDUCTION INTERNAL FIXATION (ORIF) ELBOW/OLECRANON FRACTURE (Right Elbow)     Patient location during evaluation: SICU Anesthesia Type: General Level of consciousness: sedated Pain management: pain level controlled Vital Signs Assessment: post-procedure vital signs reviewed and stable Respiratory status: patient remains intubated per anesthesia plan Cardiovascular status: stable Postop Assessment: no apparent nausea or vomiting Anesthetic complications: no    Last Vitals:  Vitals:   07/13/19 2115 07/13/19 2130  BP: 130/81 130/80  Pulse: (!) 110 (!) 121  Resp: 18 18  Temp:    SpO2: 100% 100%    Last Pain:  Vitals:   07/13/19 2000  TempSrc: Oral  PainSc:                  Shelton Silvas

## 2019-07-13 NOTE — Plan of Care (Signed)
  Problem: Education: Goal: Knowledge of General Education information will improve Description: Including pain rating scale, medication(s)/side effects and non-pharmacologic comfort measures Outcome: Not Progressing   Problem: Clinical Measurements: Goal: Ability to maintain clinical measurements within normal limits will improve Outcome: Not Progressing Goal: Will remain free from infection Outcome: Progressing
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
## 2019-07-14 ENCOUNTER — Inpatient Hospital Stay (HOSPITAL_COMMUNITY): Payer: No Typology Code available for payment source

## 2019-07-14 LAB — CBC
HCT: 22.5 % — ABNORMAL LOW (ref 39.0–52.0)
Hemoglobin: 7.4 g/dL — ABNORMAL LOW (ref 13.0–17.0)
MCH: 31.1 pg (ref 26.0–34.0)
MCHC: 32.9 g/dL (ref 30.0–36.0)
MCV: 94.5 fL (ref 80.0–100.0)
Platelets: 141 10*3/uL — ABNORMAL LOW (ref 150–400)
RBC: 2.38 MIL/uL — ABNORMAL LOW (ref 4.22–5.81)
RDW: 12.7 % (ref 11.5–15.5)
WBC: 7.2 10*3/uL (ref 4.0–10.5)
nRBC: 0 % (ref 0.0–0.2)

## 2019-07-14 LAB — HEMOGLOBIN AND HEMATOCRIT, BLOOD
HCT: 22.5 % — ABNORMAL LOW (ref 39.0–52.0)
Hemoglobin: 7.3 g/dL — ABNORMAL LOW (ref 13.0–17.0)

## 2019-07-14 LAB — GLUCOSE, CAPILLARY
Glucose-Capillary: 109 mg/dL — ABNORMAL HIGH (ref 70–99)
Glucose-Capillary: 124 mg/dL — ABNORMAL HIGH (ref 70–99)
Glucose-Capillary: 128 mg/dL — ABNORMAL HIGH (ref 70–99)
Glucose-Capillary: 145 mg/dL — ABNORMAL HIGH (ref 70–99)
Glucose-Capillary: 146 mg/dL — ABNORMAL HIGH (ref 70–99)
Glucose-Capillary: 152 mg/dL — ABNORMAL HIGH (ref 70–99)

## 2019-07-14 LAB — TRIGLYCERIDES: Triglycerides: 131 mg/dL (ref <150)

## 2019-07-14 MED ORDER — NALOXONE HCL 0.4 MG/ML IJ SOLN
INTRAMUSCULAR | Status: AC
  Administered 2019-07-14: 0.4 mg
  Filled 2019-07-14: qty 1

## 2019-07-14 MED ORDER — OXYCODONE HCL 5 MG PO TABS
5.0000 mg | ORAL_TABLET | ORAL | Status: DC | PRN
  Administered 2019-07-14: 5 mg via ORAL
  Administered 2019-07-15: 10 mg via ORAL
  Filled 2019-07-14: qty 1
  Filled 2019-07-14: qty 2

## 2019-07-14 MED ORDER — ONDANSETRON HCL 4 MG/2ML IJ SOLN
4.0000 mg | Freq: Four times a day (QID) | INTRAMUSCULAR | Status: DC
  Administered 2019-07-15: 4 mg via INTRAVENOUS
  Filled 2019-07-14: qty 2

## 2019-07-14 MED ORDER — ORAL CARE MOUTH RINSE
15.0000 mL | Freq: Two times a day (BID) | OROMUCOSAL | Status: DC
  Administered 2019-07-17 – 2019-07-23 (×9): 15 mL via OROMUCOSAL

## 2019-07-14 MED ORDER — WHITE PETROLATUM EX OINT
TOPICAL_OINTMENT | CUTANEOUS | Status: AC
  Filled 2019-07-14: qty 28.35

## 2019-07-14 MED ORDER — ORAL CARE MOUTH RINSE
15.0000 mL | Freq: Two times a day (BID) | OROMUCOSAL | Status: DC
  Administered 2019-07-15: 15 mL via OROMUCOSAL

## 2019-07-14 MED ORDER — HYDROMORPHONE HCL 1 MG/ML IJ SOLN
2.0000 mg | INTRAMUSCULAR | Status: DC | PRN
  Administered 2019-07-14 – 2019-07-24 (×20): 2 mg via INTRAVENOUS
  Filled 2019-07-14 (×20): qty 2

## 2019-07-14 MED ORDER — CHLORHEXIDINE GLUCONATE 0.12 % MT SOLN
15.0000 mL | Freq: Two times a day (BID) | OROMUCOSAL | Status: DC
  Administered 2019-07-15 – 2019-07-24 (×17): 15 mL via OROMUCOSAL
  Filled 2019-07-14 (×17): qty 15

## 2019-07-14 MED ORDER — ACETAMINOPHEN 325 MG PO TABS
650.0000 mg | ORAL_TABLET | Freq: Four times a day (QID) | ORAL | Status: DC | PRN
  Administered 2019-07-14: 650 mg via ORAL
  Filled 2019-07-14 (×2): qty 2
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
## 2019-07-14 NOTE — Progress Notes (Signed)
RT called to bedside to advance ETT due to pt agitation. Advanced ETT from 20 to 25 without any complications Tidal Volumes returned to normal. MD and RN aware of pt agitation.

## 2019-07-14 NOTE — Procedures (Signed)
Extubation Procedure Note  Patient Details:   Name: Tristan White DOB: 1985-12-04 MRN: [id number]   Airway Documentation:   Patient extubated per orders. Patient had positive cuff leak prior to extubation. Placed on 4lpm nasal cannula. Will continue to monitor. Vent end date: 07/14/19 Vent end time: 1101   Evaluation  O2 sats: stable throughout Complications: No apparent complications Patient did tolerate procedure well. Bilateral Breath Sounds: Clear, Diminished   Yes  Suszanne Conners 07/14/2019, 11:01 AM
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
## 2019-07-14 NOTE — Progress Notes (Signed)
RT NOTES: Patient placed on pressure support, RR of 8. Placed back on full support at this time.
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
## 2019-07-15 ENCOUNTER — Encounter: Payer: Self-pay | Admitting: *Deleted

## 2019-07-15 ENCOUNTER — Inpatient Hospital Stay (HOSPITAL_COMMUNITY): Payer: No Typology Code available for payment source

## 2019-07-15 LAB — COMPREHENSIVE METABOLIC PANEL
ALT: 50 U/L — ABNORMAL HIGH (ref 0–44)
AST: 118 U/L — ABNORMAL HIGH (ref 15–41)
Albumin: 2.4 g/dL — ABNORMAL LOW (ref 3.5–5.0)
Alkaline Phosphatase: 44 U/L (ref 38–126)
Anion gap: 6 (ref 5–15)
BUN: <5 mg/dL — ABNORMAL LOW (ref 6–20)
CO2: 27 mmol/L (ref 22–32)
Calcium: 7 mg/dL — ABNORMAL LOW (ref 8.9–10.3)
Chloride: 102 mmol/L (ref 98–111)
Creatinine, Ser: 0.84 mg/dL (ref 0.61–1.24)
GFR calc Af Amer: >60 mL/min (ref >60)
GFR calc non Af Amer: >60 mL/min (ref >60)
Glucose, Bld: 131 mg/dL — ABNORMAL HIGH (ref 70–99)
Potassium: 3 mmol/L — ABNORMAL LOW (ref 3.5–5.1)
Sodium: 135 mmol/L (ref 135–145)
Total Bilirubin: 0.9 mg/dL (ref 0.3–1.2)
Total Protein: 4.8 g/dL — ABNORMAL LOW (ref 6.5–8.1)

## 2019-07-15 LAB — CBC
HCT: 19.4 % — ABNORMAL LOW (ref 39.0–52.0)
Hemoglobin: 6.5 g/dL — CL (ref 13.0–17.0)
MCH: 31.3 pg (ref 26.0–34.0)
MCHC: 33.5 g/dL (ref 30.0–36.0)
MCV: 93.3 fL (ref 80.0–100.0)
Platelets: 140 10*3/uL — ABNORMAL LOW (ref 150–400)
RBC: 2.08 MIL/uL — ABNORMAL LOW (ref 4.22–5.81)
RDW: 11.9 % (ref 11.5–15.5)
WBC: 9.4 10*3/uL (ref 4.0–10.5)
nRBC: 0 % (ref 0.0–0.2)

## 2019-07-15 LAB — GLUCOSE, CAPILLARY
Glucose-Capillary: 135 mg/dL — ABNORMAL HIGH (ref 70–99)
Glucose-Capillary: 130 mg/dL — ABNORMAL HIGH (ref 70–99)
Glucose-Capillary: 148 mg/dL — ABNORMAL HIGH (ref 70–99)
Glucose-Capillary: 167 mg/dL — ABNORMAL HIGH (ref 70–99)
Glucose-Capillary: 128 mg/dL — ABNORMAL HIGH (ref 70–99)

## 2019-07-15 LAB — HEMOGLOBIN AND HEMATOCRIT, BLOOD
HCT: 22.5 % — ABNORMAL LOW (ref 39.0–52.0)
Hemoglobin: 7.7 g/dL — ABNORMAL LOW (ref 13.0–17.0)

## 2019-07-15 LAB — PREPARE RBC (CROSSMATCH)

## 2019-07-15 LAB — TYPE AND SCREEN
ABO/RH(D): B POS
Antibody Screen: NEGATIVE

## 2019-07-15 LAB — VITAMIN D 25 HYDROXY (VIT D DEFICIENCY, FRACTURES): Vit D, 25-Hydroxy: 8.15 ng/mL — ABNORMAL LOW (ref 30–100)

## 2019-07-15 MED ORDER — OXYCODONE HCL 5 MG PO TABS
10.0000 mg | ORAL_TABLET | ORAL | Status: DC | PRN
  Administered 2019-07-15 – 2019-07-18 (×9): 15 mg via ORAL
  Administered 2019-07-18 – 2019-07-19 (×3): 10 mg via ORAL
  Administered 2019-07-19: 15 mg via ORAL
  Administered 2019-07-20: 10 mg via ORAL
  Administered 2019-07-20: 15 mg via ORAL
  Administered 2019-07-20 – 2019-07-22 (×3): 10 mg via ORAL
  Administered 2019-07-23 – 2019-07-25 (×8): 15 mg via ORAL
  Filled 2019-07-15 (×3): qty 3
  Filled 2019-07-15 (×3): qty 2
  Filled 2019-07-15: qty 3
  Filled 2019-07-15 (×2): qty 2
  Filled 2019-07-15 (×4): qty 3
  Filled 2019-07-15: qty 2
  Filled 2019-07-15 (×2): qty 3
  Filled 2019-07-15: qty 2
  Filled 2019-07-15 (×10): qty 3

## 2019-07-15 MED ORDER — POTASSIUM CHLORIDE 20 MEQ/15ML (10%) PO SOLN
40.0000 meq | ORAL | Status: DC
  Administered 2019-07-15: 40 meq via ORAL
  Filled 2019-07-15: qty 30

## 2019-07-15 MED ORDER — INSULIN ASPART 100 UNIT/ML ~~LOC~~ SOLN
0.0000 [IU] | Freq: Three times a day (TID) | SUBCUTANEOUS | Status: DC
  Administered 2019-07-17 (×3): 3 [IU] via SUBCUTANEOUS
  Administered 2019-07-18: 5 [IU] via SUBCUTANEOUS
  Administered 2019-07-18 – 2019-07-19 (×4): 2 [IU] via SUBCUTANEOUS

## 2019-07-15 MED ORDER — BOOST / RESOURCE BREEZE PO LIQD CUSTOM
1.0000 | Freq: Three times a day (TID) | ORAL | Status: DC
  Administered 2019-07-15 – 2019-07-25 (×24): 1 via ORAL

## 2019-07-15 MED ORDER — ACETAMINOPHEN 500 MG PO TABS
1000.0000 mg | ORAL_TABLET | Freq: Four times a day (QID) | ORAL | Status: DC
  Administered 2019-07-15 – 2019-07-25 (×34): 1000 mg via ORAL
  Filled 2019-07-15 (×38): qty 2

## 2019-07-15 MED ORDER — METOPROLOL TARTRATE 5 MG/5ML IV SOLN
5.0000 mg | Freq: Four times a day (QID) | INTRAVENOUS | Status: DC | PRN
  Administered 2019-07-15: 5 mg via INTRAVENOUS
  Filled 2019-07-15: qty 5

## 2019-07-15 MED ORDER — CEFAZOLIN SODIUM-DEXTROSE 2-4 GM/100ML-% IV SOLN
2.0000 g | INTRAVENOUS | Status: AC
  Administered 2019-07-16: 16:00:00 2 g via INTRAVENOUS
  Filled 2019-07-15: qty 100

## 2019-07-15 MED ORDER — DOCUSATE SODIUM 100 MG PO CAPS
100.0000 mg | ORAL_CAPSULE | Freq: Two times a day (BID) | ORAL | Status: DC
  Administered 2019-07-15 – 2019-07-24 (×16): 100 mg via ORAL
  Filled 2019-07-15 (×18): qty 1

## 2019-07-15 MED ORDER — ENOXAPARIN SODIUM 30 MG/0.3ML ~~LOC~~ SOLN
30.0000 mg | Freq: Two times a day (BID) | SUBCUTANEOUS | Status: DC
  Administered 2019-07-15 – 2019-07-20 (×10): 30 mg via SUBCUTANEOUS
  Filled 2019-07-15 (×10): qty 0.3

## 2019-07-15 MED ORDER — IOHEXOL 350 MG/ML SOLN
100.0000 mL | Freq: Once | INTRAVENOUS | Status: AC | PRN
  Administered 2019-07-15: 100 mL via INTRAVENOUS

## 2019-07-15 MED ORDER — SODIUM CHLORIDE 0.9% IV SOLUTION: Freq: Once | INTRAVENOUS | Status: AC

## 2019-07-15 MED ORDER — POTASSIUM CHLORIDE CRYS ER 20 MEQ PO TBCR
40.0000 meq | EXTENDED_RELEASE_TABLET | Freq: Two times a day (BID) | ORAL | Status: AC
  Administered 2019-07-15 (×2): 40 meq via ORAL
  Filled 2019-07-15 (×2): qty 2

## 2019-07-15 MED ORDER — INSULIN ASPART 100 UNIT/ML ~~LOC~~ SOLN: 0.0000 [IU] | Freq: Two times a day (BID) | SUBCUTANEOUS | Status: DC

## 2019-07-15 MED ORDER — METHOCARBAMOL 500 MG PO TABS
1000.0000 mg | ORAL_TABLET | Freq: Three times a day (TID) | ORAL | Status: DC
  Administered 2019-07-15 – 2019-07-18 (×9): 1000 mg via ORAL
  Filled 2019-07-15 (×9): qty 2

## 2019-07-15 MED ORDER — INSULIN ASPART 100 UNIT/ML ~~LOC~~ SOLN
0.0000 [IU] | Freq: Every day | SUBCUTANEOUS | Status: DC
  Administered 2019-07-16: 2 [IU] via SUBCUTANEOUS
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
## 2019-07-15 NOTE — Progress Notes (Addendum)
Pt arrived to unit from 4N accompanied by RN and GF Shaniqwa Pt alert/oriented in no acute distress. Orientated pt to room and equipments. Hand guide/menu provided. Bed at lowest position , all wheels locked, 3 side rails up, call bell/phone within reach.Pt and GF have been informed that staff is not responsible for any losses/damages of personal belongings especilally valuables/jewelries,cellphone,etc. And if possible to be kept at home. Questions and concerns were answered satisfactorily. No complaints voiced.
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
## 2019-07-15 NOTE — Progress Notes (Signed)
Report given to Angelito on 5N. Patient transferred to 5N32 with all belongings. GF Shaniqwa at bedside.

## 2019-07-15 NOTE — Progress Notes (Signed)
Orthopedic Tech Progress Note Patient Details:  Tristan White [birth year] [id number] Called in orders to [facility] for BLE ROM KNEE BRACES unlock and [facility]IGHT [facility] for the left Patient ID: Tristan White, male   DOB: January 03, 1986, 34 y.o.   MRN: [id number]   Donald Pore 07/15/2019, 10:33 AM

## 2019-07-15 NOTE — Progress Notes (Signed)
CRITICAL VALUE ALERT  Critical Value:  Hemoglobin 6.5  Date & Time Notied:  07/15/19 0730  Provider Notified: Bedelia Person MD @ 0800 on unit  Orders Received/Actions taken: orders placed for type and screen/transfuse RBC

## 2019-07-15 NOTE — Plan of Care (Signed)
  Problem: Education: Goal: Knowledge of General Education information will improve Description: Including pain rating scale, medication(s)/side effects and non-pharmacologic comfort measures Outcome: Progressing   Problem: Health Behavior/Discharge Planning: Goal: Ability to manage health-related needs will improve Outcome: Progressing   Problem: Clinical Measurements: Goal: Ability to maintain clinical measurements within normal limits will improve Outcome: Progressing Goal: Will remain free from infection Outcome: Progressing   Problem: Activity: Goal: Risk for activity intolerance will decrease Outcome: Progressing   Problem: Nutrition: Goal: Adequate nutrition will be maintained Outcome: Progressing   Problem: Elimination: Goal: Will not experience complications related to bowel motility Outcome: Progressing   Problem: Pain Managment: Goal: General experience of comfort will improve Outcome: Progressing   Problem: Safety: Goal: Ability to remain free from injury will improve Outcome: Progressing   

## 2019-07-16 ENCOUNTER — Other Ambulatory Visit: Payer: Self-pay

## 2019-07-16 ENCOUNTER — Inpatient Hospital Stay (HOSPITAL_COMMUNITY): Payer: No Typology Code available for payment source

## 2019-07-16 ENCOUNTER — Encounter (HOSPITAL_COMMUNITY): Payer: Self-pay

## 2019-07-16 ENCOUNTER — Encounter (HOSPITAL_COMMUNITY): Admission: EM | Disposition: A | Discharge status: Rehabilitation Facility | Payer: Self-pay | Source: Home / Self Care

## 2019-07-16 DIAGNOSIS — S83511D Sprain of anterior cruciate ligament of right knee, subsequent encounter: Secondary | ICD-10-CM

## 2019-07-16 HISTORY — PX: CLOSED REDUCTION NASAL FRACTURE: SHX5365

## 2019-07-16 HISTORY — PX: ORIF TIBIA PLATEAU: SHX2132

## 2019-07-16 LAB — TRIGLYCERIDES: Triglycerides: 125 mg/dL (ref <150)

## 2019-07-16 LAB — COMPREHENSIVE METABOLIC PANEL
ALT: 49 U/L — ABNORMAL HIGH (ref 0–44)
AST: 123 U/L — ABNORMAL HIGH (ref 15–41)
Albumin: 2.6 g/dL — ABNORMAL LOW (ref 3.5–5.0)
Alkaline Phosphatase: 47 U/L (ref 38–126)
Anion gap: 6 (ref 5–15)
BUN: 5 mg/dL — ABNORMAL LOW (ref 6–20)
CO2: 26 mmol/L (ref 22–32)
Calcium: 7.9 mg/dL — ABNORMAL LOW (ref 8.9–10.3)
Chloride: 103 mmol/L (ref 98–111)
Creatinine, Ser: 0.84 mg/dL (ref 0.61–1.24)
GFR calc Af Amer: >60 mL/min (ref >60)
GFR calc non Af Amer: >60 mL/min (ref >60)
Glucose, Bld: 119 mg/dL — ABNORMAL HIGH (ref 70–99)
Potassium: 3.8 mmol/L (ref 3.5–5.1)
Sodium: 135 mmol/L (ref 135–145)
Total Bilirubin: 0.8 mg/dL (ref 0.3–1.2)
Total Protein: 5.3 g/dL — ABNORMAL LOW (ref 6.5–8.1)

## 2019-07-16 LAB — PREPARE RBC (CROSSMATCH)

## 2019-07-16 LAB — PHOSPHORUS: Phosphorus: 1.9 mg/dL — ABNORMAL LOW (ref 2.5–4.6)

## 2019-07-16 LAB — CBC
HCT: 23.8 % — ABNORMAL LOW (ref 39.0–52.0)
Hemoglobin: 8.4 g/dL — ABNORMAL LOW (ref 13.0–17.0)
MCH: 31.5 pg (ref 26.0–34.0)
MCHC: 35.3 g/dL (ref 30.0–36.0)
MCV: 89.1 fL (ref 80.0–100.0)
Platelets: 179 10*3/uL (ref 150–400)
RBC: 2.67 MIL/uL — ABNORMAL LOW (ref 4.22–5.81)
RDW: 12.7 % (ref 11.5–15.5)
WBC: 8.7 10*3/uL (ref 4.0–10.5)
nRBC: 0 % (ref 0.0–0.2)

## 2019-07-16 LAB — MAGNESIUM: Magnesium: 1.9 mg/dL (ref 1.7–2.4)

## 2019-07-16 LAB — GLUCOSE, CAPILLARY
Glucose-Capillary: 103 mg/dL — ABNORMAL HIGH (ref 70–99)
Glucose-Capillary: 95 mg/dL (ref 70–99)
Glucose-Capillary: 212 mg/dL — ABNORMAL HIGH (ref 70–99)

## 2019-07-16 SURGERY — OPEN REDUCTION INTERNAL FIXATION (ORIF) TIBIAL PLATEAU
Anesthesia: General

## 2019-07-16 MED ORDER — ONDANSETRON HCL 4 MG/2ML IJ SOLN: 4.0000 mg | Freq: Once | INTRAMUSCULAR | Status: DC | PRN

## 2019-07-16 MED ORDER — MIDAZOLAM HCL 5 MG/5ML IJ SOLN
INTRAMUSCULAR | Status: DC | PRN
  Administered 2019-07-16: 2 mg via INTRAVENOUS

## 2019-07-16 MED ORDER — POLYETHYLENE GLYCOL 3350 17 G PO PACK
17.0000 g | PACK | Freq: Every day | ORAL | Status: DC
  Administered 2019-07-17 – 2019-07-21 (×4): 17 g via ORAL
  Filled 2019-07-16 (×8): qty 1

## 2019-07-16 MED ORDER — MIDAZOLAM HCL 2 MG/2ML IJ SOLN
INTRAMUSCULAR | Status: AC
  Filled 2019-07-16: qty 2

## 2019-07-16 MED ORDER — ONDANSETRON HCL 4 MG/2ML IJ SOLN
INTRAMUSCULAR | Status: DC | PRN
  Administered 2019-07-16: 4 mg via INTRAVENOUS

## 2019-07-16 MED ORDER — ONDANSETRON HCL 4 MG/2ML IJ SOLN
INTRAMUSCULAR | Status: AC
  Filled 2019-07-16: qty 2

## 2019-07-16 MED ORDER — PROPOFOL 10 MG/ML IV BOLUS
INTRAVENOUS | Status: DC | PRN
  Administered 2019-07-16: 50 mg via INTRAVENOUS
  Administered 2019-07-16: 150 mg via INTRAVENOUS

## 2019-07-16 MED ORDER — MEPERIDINE HCL 25 MG/ML IJ SOLN
INTRAMUSCULAR | Status: AC
  Filled 2019-07-16: qty 1

## 2019-07-16 MED ORDER — HYDROMORPHONE HCL 1 MG/ML IJ SOLN
0.2500 mg | INTRAMUSCULAR | Status: DC | PRN
  Administered 2019-07-16 (×2): 0.25 mg via INTRAVENOUS
  Administered 2019-07-16: 0.5 mg via INTRAVENOUS

## 2019-07-16 MED ORDER — 0.9 % SODIUM CHLORIDE (POUR BTL) OPTIME
TOPICAL | Status: DC | PRN
  Administered 2019-07-16: 1000 mL

## 2019-07-16 MED ORDER — ROCURONIUM BROMIDE 10 MG/ML (PF) SYRINGE
PREFILLED_SYRINGE | INTRAVENOUS | Status: AC
  Filled 2019-07-16: qty 10

## 2019-07-16 MED ORDER — SUGAMMADEX SODIUM 200 MG/2ML IV SOLN
INTRAVENOUS | Status: DC | PRN
  Administered 2019-07-16: 150 mg via INTRAVENOUS

## 2019-07-16 MED ORDER — HYDROMORPHONE HCL 1 MG/ML IJ SOLN
INTRAMUSCULAR | Status: AC
  Filled 2019-07-16: qty 1

## 2019-07-16 MED ORDER — ROCURONIUM BROMIDE 10 MG/ML (PF) SYRINGE
PREFILLED_SYRINGE | INTRAVENOUS | Status: DC | PRN
  Administered 2019-07-16: 70 mg via INTRAVENOUS

## 2019-07-16 MED ORDER — DEXMEDETOMIDINE HCL 200 MCG/2ML IV SOLN
INTRAVENOUS | Status: DC | PRN
  Administered 2019-07-16: 16 ug via INTRAVENOUS
  Administered 2019-07-16 (×3): 8 ug via INTRAVENOUS

## 2019-07-16 MED ORDER — FENTANYL CITRATE (PF) 250 MCG/5ML IJ SOLN
INTRAMUSCULAR | Status: DC | PRN
  Administered 2019-07-16: 100 ug via INTRAVENOUS
  Administered 2019-07-16 (×5): 50 ug via INTRAVENOUS

## 2019-07-16 MED ORDER — DEXAMETHASONE SODIUM PHOSPHATE 10 MG/ML IJ SOLN
INTRAMUSCULAR | Status: DC | PRN
  Administered 2019-07-16: 10 mg via INTRAVENOUS

## 2019-07-16 MED ORDER — LACTATED RINGERS IV SOLN: INTRAVENOUS | Status: DC

## 2019-07-16 MED ORDER — CEFAZOLIN SODIUM-DEXTROSE 1-4 GM/50ML-% IV SOLN
INTRAVENOUS | Status: AC
  Filled 2019-07-16: qty 100

## 2019-07-16 MED ORDER — LIDOCAINE-EPINEPHRINE 1 %-1:100000 IJ SOLN
INTRAMUSCULAR | Status: AC
  Filled 2019-07-16: qty 1

## 2019-07-16 MED ORDER — LIDOCAINE 2% (20 MG/ML) 5 ML SYRINGE
INTRAMUSCULAR | Status: AC
  Filled 2019-07-16: qty 5

## 2019-07-16 MED ORDER — PROPOFOL 10 MG/ML IV BOLUS
INTRAVENOUS | Status: AC
  Filled 2019-07-16: qty 20

## 2019-07-16 MED ORDER — FENTANYL CITRATE (PF) 250 MCG/5ML IJ SOLN
INTRAMUSCULAR | Status: AC
  Filled 2019-07-16: qty 5

## 2019-07-16 MED ORDER — TRAMADOL HCL 50 MG PO TABS
50.0000 mg | ORAL_TABLET | Freq: Four times a day (QID) | ORAL | Status: DC
  Administered 2019-07-16 – 2019-07-24 (×28): 50 mg via ORAL
  Filled 2019-07-16 (×29): qty 1

## 2019-07-16 MED ORDER — LIDOCAINE-EPINEPHRINE 1 %-1:100000 IJ SOLN
INTRAMUSCULAR | Status: DC | PRN
  Administered 2019-07-16: 2 mL via INTRADERMAL

## 2019-07-16 MED ORDER — LIDOCAINE 2% (20 MG/ML) 5 ML SYRINGE
INTRAMUSCULAR | Status: DC | PRN
  Administered 2019-07-16: 100 mg via INTRAVENOUS

## 2019-07-16 MED ORDER — CEFAZOLIN SODIUM-DEXTROSE 2-4 GM/100ML-% IV SOLN
2.0000 g | Freq: Three times a day (TID) | INTRAVENOUS | Status: AC
  Administered 2019-07-16 – 2019-07-17 (×3): 2 g via INTRAVENOUS
  Filled 2019-07-16 (×3): qty 100

## 2019-07-16 MED ORDER — DEXAMETHASONE SODIUM PHOSPHATE 10 MG/ML IJ SOLN
INTRAMUSCULAR | Status: AC
  Filled 2019-07-16: qty 1

## 2019-07-16 MED ORDER — MEPERIDINE HCL 25 MG/ML IJ SOLN
6.2500 mg | INTRAMUSCULAR | Status: DC | PRN
  Administered 2019-07-16: 12.5 mg via INTRAVENOUS

## 2019-07-16 SURGICAL SUPPLY — 100 items
BANDAGE ESMARK 6X9 LF (GAUZE/BANDAGES/DRESSINGS) ×2 IMPLANT
BENZOIN TINCTURE PRP APPL 2/3 (GAUZE/BANDAGES/DRESSINGS) ×4 IMPLANT
BIT DRILL 2.5X2.75 QC CALB (BIT) ×4 IMPLANT
BIT DRILL CAL (BIT) ×2 IMPLANT
BLADE CLIPPER SURG (BLADE) IMPLANT
BLADE SURG 10 STRL SS (BLADE) ×4 IMPLANT
BLADE SURG 15 STRL LF DISP TIS (BLADE) ×2 IMPLANT
BLADE SURG 15 STRL SS (BLADE) ×2
BNDG COHESIVE 4X5 TAN STRL (GAUZE/BANDAGES/DRESSINGS) ×4 IMPLANT
BNDG ELASTIC 4X5.8 VLCR STR LF (GAUZE/BANDAGES/DRESSINGS) ×4 IMPLANT
BNDG ELASTIC 6X5.8 VLCR STR LF (GAUZE/BANDAGES/DRESSINGS) ×4 IMPLANT
BNDG ESMARK 6X9 LF (GAUZE/BANDAGES/DRESSINGS) ×4
BNDG GAUZE ELAST 4 BULKY (GAUZE/BANDAGES/DRESSINGS) ×8 IMPLANT
BONE CANC CHIPS 40CC CAN1/2 (Bone Implant) ×4 IMPLANT
BRUSH SCRUB EZ PLAIN DRY (MISCELLANEOUS) ×8 IMPLANT
CANISTER SUCT 3000ML PPV (MISCELLANEOUS) ×8 IMPLANT
CHIPS CANC BONE 40CC CAN1/2 (Bone Implant) ×2 IMPLANT
CLOSURE WOUND 1/2 X4 (GAUZE/BANDAGES/DRESSINGS) ×1
COVER BACK TABLE 60X90IN (DRAPES) ×4 IMPLANT
COVER MAYO STAND STRL (DRAPES) ×4 IMPLANT
COVER SURGICAL LIGHT HANDLE (MISCELLANEOUS) ×4 IMPLANT
COVER WAND RF STERILE (DRAPES) ×8 IMPLANT
CUFF TOURN SGL QUICK 34 (TOURNIQUET CUFF) ×2
CUFF TRNQT CYL 34X4.125X (TOURNIQUET CUFF) ×2 IMPLANT
DRAPE C-ARM 42X72 X-RAY (DRAPES) ×4 IMPLANT
DRAPE C-ARMOR (DRAPES) ×4 IMPLANT
DRAPE HALF SHEET 40X57 (DRAPES) IMPLANT
DRAPE INCISE IOBAN 66X45 STRL (DRAPES) ×4 IMPLANT
DRAPE U-SHAPE 47X51 STRL (DRAPES) ×4 IMPLANT
DRESSING MEROCEL 8CM (GAUZE/BANDAGES/DRESSINGS) ×4 IMPLANT
DRESSING NASAL POPE 10X1.5X2.5 (GAUZE/BANDAGES/DRESSINGS) IMPLANT
DRILL BIT CAL (BIT) ×4
DRSG ADAPTIC 3X8 NADH LF (GAUZE/BANDAGES/DRESSINGS) ×4 IMPLANT
DRSG MEPITEL 4X7.2 (GAUZE/BANDAGES/DRESSINGS) ×4 IMPLANT
DRSG MEROCEL 8CM (GAUZE/BANDAGES/DRESSINGS) ×8
DRSG NASAL POPE 10X1.5X2.5 (GAUZE/BANDAGES/DRESSINGS)
DRSG PAD ABDOMINAL 8X10 ST (GAUZE/BANDAGES/DRESSINGS) ×8 IMPLANT
DRSG TELFA 3X8 NADH (GAUZE/BANDAGES/DRESSINGS) ×4 IMPLANT
ELECT REM PT RETURN 9FT ADLT (ELECTROSURGICAL) ×4
ELECTRODE REM PT RTRN 9FT ADLT (ELECTROSURGICAL) ×2 IMPLANT
GAUZE 4X4 16PLY RFD (DISPOSABLE) ×4 IMPLANT
GAUZE SPONGE 4X4 12PLY STRL (GAUZE/BANDAGES/DRESSINGS) ×4 IMPLANT
GLOVE BIO SURGEON STRL SZ7.5 (GLOVE) ×4 IMPLANT
GLOVE BIO SURGEON STRL SZ8 (GLOVE) ×4 IMPLANT
GLOVE BIOGEL PI IND STRL 7.5 (GLOVE) ×2 IMPLANT
GLOVE BIOGEL PI IND STRL 8 (GLOVE) ×2 IMPLANT
GLOVE BIOGEL PI INDICATOR 7.5 (GLOVE) ×2
GLOVE BIOGEL PI INDICATOR 8 (GLOVE) ×2
GLOVE ECLIPSE 7.5 STRL STRAW (GLOVE) ×4 IMPLANT
GOWN STRL REUS W/ TWL LRG LVL3 (GOWN DISPOSABLE) ×6 IMPLANT
GOWN STRL REUS W/ TWL XL LVL3 (GOWN DISPOSABLE) ×2 IMPLANT
GOWN STRL REUS W/TWL LRG LVL3 (GOWN DISPOSABLE) ×6
GOWN STRL REUS W/TWL XL LVL3 (GOWN DISPOSABLE) ×2
IMMOBILIZER KNEE 22 UNIV (SOFTGOODS) ×4 IMPLANT
K-WIRE ACE 1.6X6 (WIRE) ×24
KIT BASIN OR (CUSTOM PROCEDURE TRAY) ×4 IMPLANT
KIT TURNOVER KIT B (KITS) ×8 IMPLANT
KWIRE ACE 1.6X6 (WIRE) ×12 IMPLANT
NDL SUT 6 .5 CRC .975X.05 MAYO (NEEDLE) IMPLANT
NEEDLE MAYO TAPER (NEEDLE)
NEEDLE PRECISIONGLIDE 27X1.5 (NEEDLE) ×4 IMPLANT
NS IRRIG 1000ML POUR BTL (IV SOLUTION) ×8 IMPLANT
PACK ORTHO EXTREMITY (CUSTOM PROCEDURE TRAY) ×4 IMPLANT
PAD ARMBOARD 7.5X6 YLW CONV (MISCELLANEOUS) ×16 IMPLANT
PAD CAST 4YDX4 CTTN HI CHSV (CAST SUPPLIES) ×2 IMPLANT
PADDING CAST COTTON 4X4 STRL (CAST SUPPLIES) ×2
PADDING CAST COTTON 6X4 STRL (CAST SUPPLIES) ×4 IMPLANT
PATTIES SURGICAL .5 X3 (DISPOSABLE) ×4 IMPLANT
PLATE LOCK 7H STD LT PROX TIB (Plate) ×4 IMPLANT
SCREW CORTICAL 3.5MM  44MM (Screw) ×2 IMPLANT
SCREW CORTICAL 3.5MM 36MM (Screw) ×4 IMPLANT
SCREW CORTICAL 3.5MM 40MM (Screw) ×4 IMPLANT
SCREW CORTICAL 3.5MM 44MM (Screw) ×2 IMPLANT
SCREW LOCK CORT STAR 3.5X70 (Screw) ×8 IMPLANT
SCREW LOCK CORT STAR 3.5X80 (Screw) ×8 IMPLANT
SCREW LOCK CORT STAR 3.5X85 (Screw) ×8 IMPLANT
SCREW LOW PROF CORTICAL 3.5X80 (Screw) ×8 IMPLANT
SPLINT NASAL THERMO PLAST (MISCELLANEOUS) ×4 IMPLANT
SPONGE LAP 18X18 RF (DISPOSABLE) ×4 IMPLANT
STAPLER VISISTAT 35W (STAPLE) ×4 IMPLANT
STOCKINETTE IMPERVIOUS LG (DRAPES) ×4 IMPLANT
STRIP CLOSURE SKIN 1/2X4 (GAUZE/BANDAGES/DRESSINGS) ×3 IMPLANT
SUCTION FRAZIER HANDLE 10FR (MISCELLANEOUS) ×2
SUCTION TUBE FRAZIER 10FR DISP (MISCELLANEOUS) ×2 IMPLANT
SUT ETHILON 3 0 PS 1 (SUTURE) IMPLANT
SUT PROLENE 0 CT 2 (SUTURE) ×8 IMPLANT
SUT VIC AB 0 CT1 27 (SUTURE) ×2
SUT VIC AB 0 CT1 27XBRD ANBCTR (SUTURE) ×2 IMPLANT
SUT VIC AB 1 CT1 27 (SUTURE) ×2
SUT VIC AB 1 CT1 27XBRD ANBCTR (SUTURE) ×2 IMPLANT
SUT VIC AB 2-0 CT1 27 (SUTURE) ×4
SUT VIC AB 2-0 CT1 TAPERPNT 27 (SUTURE) ×4 IMPLANT
SYR CONTROL 10ML LL (SYRINGE) ×4 IMPLANT
TOWEL GREEN STERILE (TOWEL DISPOSABLE) ×8 IMPLANT
TOWEL GREEN STERILE FF (TOWEL DISPOSABLE) ×8 IMPLANT
TRAY FOLEY MTR SLVR 16FR STAT (SET/KITS/TRAYS/PACK) IMPLANT
TUBE CONNECTING 12'X1/4 (SUCTIONS) ×2
TUBE CONNECTING 12X1/4 (SUCTIONS) ×6 IMPLANT
WATER STERILE IRR 1000ML POUR (IV SOLUTION) ×12 IMPLANT
YANKAUER SUCT BULB TIP NO VENT (SUCTIONS) ×4 IMPLANT
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
## 2019-07-16 NOTE — Transfer of Care (Signed)
Immediate Anesthesia Transfer of Care Note  Patient: Tristan White  Procedure(s) Performed: OPEN REDUCTION INTERNAL FIXATION (ORIF) TIBIAL PLATEAU (Left ) CLOSED REDUCTION NASAL FRACTURE (N/A )  Patient Location: PACU  Anesthesia Type:General  Level of Consciousness: drowsy  Airway & Oxygen Therapy: Patient Spontanous Breathing and Patient connected to face mask oxygen  Post-op Assessment: Report given to RN and Post -op Vital signs reviewed and stable  Post vital signs: Reviewed and stable  Last Vitals:  Vitals Value Taken Time  BP 138/95 07/16/19 1825  Temp    Pulse 73 07/16/19 1828  Resp 15 07/16/19 1828  SpO2 100 % 07/16/19 1828  Vitals shown include unvalidated device data.  Last Pain:  Vitals:   07/16/19 1825  TempSrc:   PainSc: (P) Asleep         Complications: No apparent anesthesia complications

## 2019-07-16 NOTE — Anesthesia Pain Management Evaluation Note (Signed)
Report received from Ace Gins, RN.

## 2019-07-16 NOTE — Anesthesia Procedure Notes (Signed)
Procedure Name: Intubation Date/Time: 07/16/2019 3:24 PM Performed by: Mariea Clonts, CRNA Pre-anesthesia Checklist: Patient identified, Emergency Drugs available, Suction available and Patient being monitored Patient Re-evaluated:Patient Re-evaluated prior to induction Oxygen Delivery Method: Circle System Utilized Preoxygenation: Pre-oxygenation with 100% oxygen Induction Type: IV induction Ventilation: Mask ventilation without difficulty Laryngoscope Size: Mac and 4 Grade View: Grade I Tube type: Oral Tube size: 7.5 mm Number of attempts: 1 Airway Equipment and Method: Stylet and Oral airway Placement Confirmation: ETT inserted through vocal cords under direct vision,  positive ETCO2 and breath sounds checked- equal and bilateral Tube secured with: Tape Dental Injury: Teeth and Oropharynx as per pre-operative assessment
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
## 2019-07-16 NOTE — Progress Notes (Signed)
Rehab Admissions Coordinator Note:  Patient was screened by Clois Dupes for appropriateness for an Inpatient Acute Rehab Consult per OT recs.   At this time, we are recommending Inpatient Rehab consult. Please place order for consult.  Clois Dupes RN MSN 07/16/2019, 10:59 AM  I can be reached at [phone].

## 2019-07-16 NOTE — Plan of Care (Signed)
  Problem: Education: Goal: Knowledge of General Education information will improve Description: Including pain rating scale, medication(s)/side effects and non-pharmacologic comfort measures Outcome: Progressing   Problem: Health Behavior/Discharge Planning: Goal: Ability to manage health-related needs will improve Outcome: Progressing   Problem: Clinical Measurements: Goal: Will remain free from infection Outcome: Progressing   Problem: Activity: Goal: Risk for activity intolerance will decrease Outcome: Progressing   Problem: Nutrition: Goal: Adequate nutrition will be maintained Outcome: Progressing   Problem: Pain Managment: Goal: General experience of comfort will improve Outcome: Progressing   Problem: Safety: Goal: Ability to remain free from injury will improve Outcome: Progressing   Problem: Skin Integrity: Goal: Risk for impaired skin integrity will decrease Outcome: Progressing   

## 2019-07-16 NOTE — Progress Notes (Signed)
Report given to [facility] [phone].  Pt still complaining of 8/10 pain but is insistent on getting back to his room and says he doesn't want anything additional for pain.

## 2019-07-16 NOTE — Anesthesia Preprocedure Evaluation (Signed)
Anesthesia Evaluation  Patient identified by MRN, date of birth, ID band Patient awake and Patient unresponsive    Reviewed: Allergy & Precautions, NPO status , Patient's Chart, lab work & pertinent test results  Airway Mallampati: II  TM Distance: >3 FB Neck ROM: Full    Dental no notable dental hx. (+) Teeth Intact   Pulmonary asthma , Current Smoker,  Nasal Fx   Pulmonary exam normal breath sounds clear to auscultation       Cardiovascular negative cardio ROS Normal cardiovascular exam Rhythm:Regular Rate:Normal     Neuro/Psych negative neurological ROS  negative psych ROS   GI/Hepatic negative GI ROS, Neg liver ROS,   Endo/Other  negative endocrine ROS  Renal/GU negative Renal ROS     Musculoskeletal Multiple Fx's- nasal, left femur (open) S/P IM nail, Left femoral neck S/P ORIF, Left Tibial plateau, Right Olecranon S/P ORIF, Right knee ACL disruption   Abdominal   Peds  Hematology  (+) anemia , Acute blood loss anemia   Anesthesia Other Findings   Reproductive/Obstetrics                           Anesthesia Physical  Anesthesia Plan  ASA: II and emergent  Anesthesia Plan: General   Post-op Pain Management:    Induction: Inhalational  PONV Risk Score and Plan: 3 and Ondansetron, Midazolam, Treatment may vary due to age or medical condition and Promethazine  Airway Management Planned: Oral ETT  Additional Equipment: None  Intra-op Plan:   Post-operative Plan: Extubation in OR  Informed Consent: I have reviewed the patients History and Physical, chart, labs and discussed the procedure including the risks, benefits and alternatives for the proposed anesthesia with the patient or authorized representative who has indicated his/her understanding and acceptance.     Dental advisory given  Plan Discussed with: CRNA and Surgeon  Anesthesia Plan Comments:         Anesthesia Quick Evaluation
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
## 2019-07-16 NOTE — Progress Notes (Signed)
Right knee imaging studies reviewed.  Full consult to follow.
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
## 2019-07-17 ENCOUNTER — Encounter: Payer: Self-pay | Admitting: *Deleted

## 2019-07-17 LAB — COMPREHENSIVE METABOLIC PANEL
ALT: 43 U/L (ref 0–44)
AST: 79 U/L — ABNORMAL HIGH (ref 15–41)
Albumin: 2.6 g/dL — ABNORMAL LOW (ref 3.5–5.0)
Alkaline Phosphatase: 46 U/L (ref 38–126)
Anion gap: 10 (ref 5–15)
BUN: 8 mg/dL (ref 6–20)
CO2: 25 mmol/L (ref 22–32)
Calcium: 8.4 mg/dL — ABNORMAL LOW (ref 8.9–10.3)
Chloride: 101 mmol/L (ref 98–111)
Creatinine, Ser: 0.74 mg/dL (ref 0.61–1.24)
GFR calc Af Amer: >60 mL/min (ref >60)
GFR calc non Af Amer: >60 mL/min (ref >60)
Glucose, Bld: 194 mg/dL — ABNORMAL HIGH (ref 70–99)
Potassium: 3.6 mmol/L (ref 3.5–5.1)
Sodium: 136 mmol/L (ref 135–145)
Total Bilirubin: 0.5 mg/dL (ref 0.3–1.2)
Total Protein: 5.9 g/dL — ABNORMAL LOW (ref 6.5–8.1)

## 2019-07-17 LAB — CBC
HCT: 22.8 % — ABNORMAL LOW (ref 39.0–52.0)
Hemoglobin: 8 g/dL — ABNORMAL LOW (ref 13.0–17.0)
MCH: 30.3 pg (ref 26.0–34.0)
MCHC: 35.1 g/dL (ref 30.0–36.0)
MCV: 86.4 fL (ref 80.0–100.0)
Platelets: 246 10*3/uL (ref 150–400)
RBC: 2.64 MIL/uL — ABNORMAL LOW (ref 4.22–5.81)
RDW: 12.2 % (ref 11.5–15.5)
WBC: 9.8 10*3/uL (ref 4.0–10.5)
nRBC: 0 % (ref 0.0–0.2)

## 2019-07-17 LAB — PHOSPHORUS: Phosphorus: 3.6 mg/dL (ref 2.5–4.6)

## 2019-07-17 LAB — GLUCOSE, CAPILLARY
Glucose-Capillary: 170 mg/dL — ABNORMAL HIGH (ref 70–99)
Glucose-Capillary: 184 mg/dL — ABNORMAL HIGH (ref 70–99)
Glucose-Capillary: 169 mg/dL — ABNORMAL HIGH (ref 70–99)
Glucose-Capillary: 115 mg/dL — ABNORMAL HIGH (ref 70–99)

## 2019-07-17 LAB — MAGNESIUM: Magnesium: 1.9 mg/dL (ref 1.7–2.4)

## 2019-07-17 MED ORDER — MAGNESIUM CITRATE PO SOLN: 0.5000 | Freq: Once | ORAL | Status: DC

## 2019-07-17 MED ORDER — MAGNESIUM CITRATE PO SOLN
1.0000 | Freq: Once | ORAL | Status: AC
  Administered 2019-07-17: 1 via ORAL
  Filled 2019-07-17: qty 296

## 2019-07-17 MED ORDER — TRAZODONE HCL 50 MG PO TABS
50.0000 mg | ORAL_TABLET | Freq: Every day | ORAL | Status: DC
  Administered 2019-07-17: 50 mg via ORAL
  Filled 2019-07-17: qty 1
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
## 2019-07-17 NOTE — Anesthesia Postprocedure Evaluation (Signed)
Anesthesia Post Note  Patient: Jagjit Riner Tuccillo  Procedure(s) Performed: OPEN REDUCTION INTERNAL FIXATION (ORIF) TIBIAL PLATEAU (Left ) CLOSED REDUCTION NASAL FRACTURE (N/A )     Patient location during evaluation: PACU Anesthesia Type: General Level of consciousness: awake and alert Pain management: pain level controlled Vital Signs Assessment: post-procedure vital signs reviewed and stable Respiratory status: spontaneous breathing, nonlabored ventilation, respiratory function stable and patient connected to nasal cannula oxygen Cardiovascular status: blood pressure returned to baseline and stable Postop Assessment: no apparent nausea or vomiting Anesthetic complications: no    Last Vitals:  Vitals:   07/17/19 0731 07/17/19 1303  BP: 133/84 124/73  Pulse: 69 85  Resp: 17 16  Temp: 36.8 C 37.2 C  SpO2: 100% 100%    Last Pain:  Vitals:   07/17/19 1303  TempSrc: Oral  PainSc:                  Shelton Silvas
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
## 2019-07-17 NOTE — Plan of Care (Signed)

## 2019-07-17 NOTE — Progress Notes (Signed)
1 Day Post-Op   Subjective/Chief Complaint: Patient feels well, resting comfortably in bed on my exam. Denies nausea/vomiting overnight. Reports that his nasal splint was loose and was removed yesterday evening. No epistaxis.    Objective: Vital signs in last 24 hours: Temp:  [97.8 F (36.6 C)-99.3 F (37.4 C)] 98.2 F (36.8 C) (04/28 0731) Pulse Rate:  [69-95] 69 (04/28 0731) Resp:  [10-18] 17 (04/28 0731) BP: (130-160)/(65-99) 133/84 (04/28 0731) SpO2:  [100 %] 100 % (04/28 0731) Last BM Date: 07/12/19  Intake/Output from previous day: 04/27 0701 - 04/28 0700 In: 1919.4 [P.O.:680; I.V.:1039.4; IV Piggyback:200] Out: 3800 [Urine:3600; Blood:200] Intake/Output this shift: No intake/output data recorded.  Maxillofacial exam: Nose with appropriate projection and symmetry, slight deviation of the nasal septum to the left No septal hematoma Moderate bilateral alar edema No epistaxis  Lab Results:  Recent Labs    07/15/19 0645 07/15/19 0645 07/15/19 1500 07/16/19 0407  WBC 9.4  --   --  8.7  HGB 6.5*   < > 7.7* 8.4*  HCT 19.4*   < > 22.5* 23.8*  PLT 140*  --   --  179   < > = values in this interval not displayed.   BMET Recent Labs    07/15/19 0645 07/16/19 0407  NA 135 135  K 3.0* 3.8  CL 102 103  CO2 27 26  GLUCOSE 131* 119*  BUN <5* 5*  CREATININE 0.84 0.84  CALCIUM 7.0* 7.9*   PT/INR No results for input(s): LABPROT, INR in the last 72 hours. ABG No results for input(s): PHART, HCO3 in the last 72 hours.  Invalid input(s): PCO2, PO2  Studies/Results: DG Knee 1-2 Views Right  Result Date: 07/16/2019 CLINICAL DATA:  ORIF left tibia EXAM: RIGHT KNEE - 1-2 VIEW COMPARISON:  07/14/2019 FINDINGS: Single frontal view of the right knee obtained during left ORIF. Total fluoroscopy time was 1 minutes 11 seconds. Avulsion fracture fragment off the lateral aspect of the proximal tibia. IMPRESSION: Intraoperative single spot view right knee obtained during left  ORIF for tibial fracture. Electronically Signed   By: Jasmine Pang M.D.   On: 07/16/2019 19:26   CT ANGIO LOW EXTREM RIGHT W &/OR WO CONTRAST  Result Date: 07/15/2019 CLINICAL DATA:  Suspected popliteal arterial entrapment syndrome. Rule out dissection. Left femur fracture post ORIF. EXAM: CT ANGIOGRAPHY OF THE BILATERAL LOWER EXTREMITIES TECHNIQUE: Multidetector CT imaging of the bilateral lower extremitieswas performed using the standard protocol during bolus administration of intravenous contrast. Multiplanar CT image reconstructions and MIPs were obtained to evaluate the vascular anatomy. CONTRAST:  OMNIPAQUE IOHEXOL 350 MG/ML SOLN COMPARISON:  Noncontrast study from previous day FINDINGS: VASCULAR Aorta: Visualized distal segment unremarkable. IMA: Patent without evidence of aneurysm, dissection, vasculitis or significant stenosis. RIGHT Lower Extremity Inflow: Common, internal and external iliac arteries are patent without evidence of aneurysm, dissection, vasculitis or significant stenosis. Outflow: Common, superficial and profunda femoral arteries and the popliteal artery are patent without evidence of aneurysm, dissection, vasculitis or significant stenosis. Streak artifact from hardware about the knee results in some image degradation. Runoff: There is significant venous opacification limiting trifurcation runoff evaluation. LEFT Lower Extremity Inflow: Common, internal and external iliac arteries are patent without evidence of aneurysm, dissection, vasculitis or significant stenosis. Outflow: Common femoral unremarkable. Deep femoral branches patent. SFA patent. Popliteal artery widely patent, unremarkable; no evidence of dissection or stenosis. Streak artifact from hardware results in some degradation of images just above the knee. Runoff: Anterior tibial widely  patent throughout. Peroneal artery not well seen below the calf; there is significant venous opacification which limits evaluation.  Posterior tibial artery appears contiguous across the ankle. Veins: No obvious venous abnormality within the limitations of this arterial phase study. Review of the MIP images confirms the above findings. NON-VASCULAR Hepatobiliary: Visualized portions unremarkable Pancreas: Negative limited evaluation Urinary Tract: Visualized portions of kidneys unremarkable. Urinary bladder partially decompressed by Foley catheter. Bowel: Visualized portions of small bowel and colon are decompressed, unremarkable. Lymphatic: No pelvic or retroperitoneal adenopathy identified Reproductive: Prostate unremarkable. Other: No ascites. No free air. Musculoskeletal: Left basicervical femoral neck fracture status post orthopedic pinning x3. IM rod extends across comminuted midshaft left femoral fracture, with 2 proximal and 2 distal interlocking screws; major fracture fragments in near anatomic alignment. Left medial tibial plateau fracture, fracture fragments distracted 2-4 mm. Avulsion fracture fragment from the lateral margin right tibial plateau. IMPRESSION: 1. No significant arterial occlusive disease, dissection, active extravasation, or other acute vascular pathology evident in either lower extremity. Limited trifurcation runoff evaluation due to venous opacification. 2. Bilateral lower extremity orthopedic injuries as previously described. Electronically Signed   By: Corlis Leak M.D.   On: 07/15/2019 12:27   DG Knee Complete 4 Views Left  Result Date: 07/16/2019 CLINICAL DATA:  ORIF EXAM: LEFT KNEE - COMPLETE 4+ VIEW; DG C-ARM 1-60 MIN COMPARISON:  07/14/2019 FINDINGS: Eight low resolution intraoperative spot views of the left knee. Total fluoroscopy time was 1 minutes 11 seconds. Previous intramedullary rod and distal screw fixation of the femur. Comminuted tibial plateau fracture with subsequent placement of lateral plate and multiple fixating screws. IMPRESSION: Intraoperative fluoroscopic assistance provided during  surgical fixation of tibial plateau fracture Electronically Signed   By: Jasmine Pang M.D.   On: 07/16/2019 19:19   DG Knee Left Port  Result Date: 07/16/2019 CLINICAL DATA:  Open reduction internal fixation for tibial plateau fracture EXAM: PORTABLE LEFT KNEE - 4 VIEW COMPARISON:  Intraoperative study July 16, 2019; CT left knee July 14, 2019 FINDINGS: Frontal, lateral, and bilateral oblique views were obtained. There is screw and plate fixation through a comminuted fracture through the lateral tibial plateau as well as the medial proximal tibial metaphysis. Alignment in these areas is near anatomic post screw and plate fixation. Postoperative changes also noted in the distal femur. No new fracture. No dislocation. There is air within the joint, expected postoperative finding. IMPRESSION: Screw and plate fixation through comminuted proximal tibial fracture with alignment near anatomic in these areas post surgical fixation. Postoperative change also noted in visualized distal femur. No new fracture. No dislocation. Air noted within the knee joint, a postoperative finding. Electronically Signed   By: Bretta Bang III M.D.   On: 07/16/2019 20:22   DG C-Arm 1-60 Min  Result Date: 07/16/2019 CLINICAL DATA:  ORIF EXAM: LEFT KNEE - COMPLETE 4+ VIEW; DG C-ARM 1-60 MIN COMPARISON:  07/14/2019 FINDINGS: Eight low resolution intraoperative spot views of the left knee. Total fluoroscopy time was 1 minutes 11 seconds. Previous intramedullary rod and distal screw fixation of the femur. Comminuted tibial plateau fracture with subsequent placement of lateral plate and multiple fixating screws. IMPRESSION: Intraoperative fluoroscopic assistance provided during surgical fixation of tibial plateau fracture Electronically Signed   By: Jasmine Pang M.D.   On: 07/16/2019 19:19    Anti-infectives: Anti-infectives (From admission, onward)   Start     Dose/Rate Route Frequency Ordered Stop   07/16/19 2200  ceFAZolin  (ANCEF) IVPB 2g/100 mL premix  2 g 200 mL/hr over 30 Minutes Intravenous Every 8 hours 07/16/19 2029 07/17/19 2159   07/16/19 0600  ceFAZolin (ANCEF) IVPB 2g/100 mL premix     2 g 200 mL/hr over 30 Minutes Intravenous On call to O.R. 07/15/19 1138 07/16/19 1604   07/14/19 2200  ceFAZolin (ANCEF) IVPB 2g/100 mL premix  Status:  Discontinued     2 g 200 mL/hr over 30 Minutes Intravenous Every 8 hours 07/13/19 2107 07/13/19 2244   07/14/19 0100  ceFAZolin (ANCEF) IVPB 2g/100 mL premix     2 g 200 mL/hr over 30 Minutes Intravenous Every 8 hours 07/13/19 2244 07/14/19 1757   07/13/19 2045  ceFAZolin (ANCEF) IVPB 2g/100 mL premix  Status:  Discontinued     2 g 200 mL/hr over 30 Minutes Intravenous Every 8 hours 07/13/19 2038 07/13/19 2107   07/13/19 1200  ceFAZolin (ANCEF) IVPB 2g/100 mL premix  Status:  Discontinued     2 g 200 mL/hr over 30 Minutes Intravenous Every 8 hours 07/13/19 1013 07/13/19 2042   07/13/19 0400  ceFAZolin (ANCEF) IVPB 2g/100 mL premix     2 g 200 mL/hr over 30 Minutes Intravenous STAT 07/13/19 0352 07/13/19 0428      Assessment/Plan: s/p Procedure(s): OPEN REDUCTION INTERNAL FIXATION (ORIF) TIBIAL PLATEAU (Left) CLOSED REDUCTION NASAL FRACTURE (N/A)   POD#1 s/p closed reduction of nasal fractures. Patient removed his nasal splint and packings yesterday post-operatively. Given absence of epistaxis, will opt to not replace packings.   Plan to replace nasal splint this afternoon bedside  LOS: 4 days    Gardner Candle 07/17/2019

## 2019-07-17 NOTE — Plan of Care (Signed)
Continuing foley d/t back to OR tomorrow. Will continue to monitor.   Problem: Education: Goal: Knowledge of General Education information will improve Description: Including pain rating scale, medication(s)/side effects and non-pharmacologic comfort measures Outcome: Progressing   Problem: Health Behavior/Discharge Planning: Goal: Ability to manage health-related needs will improve Outcome: Progressing   Problem: Clinical Measurements: Goal: Will remain free from infection Outcome: Progressing   Problem: Activity: Goal: Risk for activity intolerance will decrease Outcome: Progressing   Problem: Elimination: Goal: Will not experience complications related to bowel motility Outcome: Progressing   Problem: Pain Managment: Goal: General experience of comfort will improve Outcome: Progressing   Problem: Safety: Goal: Ability to remain free from injury will improve Outcome: Progressing   Problem: Skin Integrity: Goal: Risk for impaired skin integrity will decrease Outcome: Progressing

## 2019-07-17 NOTE — Progress Notes (Signed)
Inpatient Rehab Admissions:  Inpatient Rehab Consult received.  Noted pt is POD 1 from surgery. Will follow to see how he tolerates therapy sessions.   Cheri Rous, OTR/L  Rehab Admissions Coordinator  [phone] 07/17/2019 1:13 PM

## 2019-07-18 LAB — CBC
HCT: 24.9 % — ABNORMAL LOW (ref 39.0–52.0)
Hemoglobin: 8.5 g/dL — ABNORMAL LOW (ref 13.0–17.0)
MCH: 29.9 pg (ref 26.0–34.0)
MCHC: 34.1 g/dL (ref 30.0–36.0)
MCV: 87.7 fL (ref 80.0–100.0)
Platelets: 315 10*3/uL (ref 150–400)
RBC: 2.84 MIL/uL — ABNORMAL LOW (ref 4.22–5.81)
RDW: 12.1 % (ref 11.5–15.5)
WBC: 8.2 10*3/uL (ref 4.0–10.5)
nRBC: 0 % (ref 0.0–0.2)

## 2019-07-18 LAB — GLUCOSE, CAPILLARY
Glucose-Capillary: 124 mg/dL — ABNORMAL HIGH (ref 70–99)
Glucose-Capillary: 210 mg/dL — ABNORMAL HIGH (ref 70–99)
Glucose-Capillary: 127 mg/dL — ABNORMAL HIGH (ref 70–99)
Glucose-Capillary: 160 mg/dL — ABNORMAL HIGH (ref 70–99)

## 2019-07-18 LAB — PHOSPHORUS: Phosphorus: 3.2 mg/dL (ref 2.5–4.6)

## 2019-07-18 LAB — MAGNESIUM: Magnesium: 2.1 mg/dL (ref 1.7–2.4)

## 2019-07-18 MED ORDER — HYDROXYZINE HCL 25 MG PO TABS: 25.0000 mg | ORAL_TABLET | Freq: Every evening | ORAL | Status: DC | PRN

## 2019-07-18 MED ORDER — METHOCARBAMOL 500 MG PO TABS
1000.0000 mg | ORAL_TABLET | Freq: Four times a day (QID) | ORAL | Status: DC
  Administered 2019-07-18 – 2019-07-25 (×25): 1000 mg via ORAL
  Filled 2019-07-18 (×25): qty 2

## 2019-07-18 MED ORDER — TRAZODONE HCL 50 MG PO TABS
50.0000 mg | ORAL_TABLET | Freq: Every day | ORAL | Status: DC
  Administered 2019-07-18: 50 mg via ORAL
  Filled 2019-07-18 (×2): qty 1

## 2019-07-18 NOTE — Progress Notes (Signed)
PT Cancellation Note  Patient Details Name: BEXLEY MCLESTER MRN: [id number] DOB: 10/19/1985   Cancelled Treatment:    Reason Eval/Treat Not Completed: Pain limiting ability to participate. Pt reporting significant pain and feeling like he may pass out. Will check back as time allows.    Sheral Apley 07/18/2019, 12:53 PM

## 2019-07-18 NOTE — Plan of Care (Signed)
  Problem: Clinical Measurements: Goal: Ability to maintain clinical measurements within normal limits will improve Outcome: Progressing   Problem: Health Behavior/Discharge Planning: Goal: Ability to manage health-related needs will improve Outcome: Progressing   Problem: Activity: Goal: Risk for activity intolerance will decrease Outcome: Progressing   Problem: Nutrition: Goal: Adequate nutrition will be maintained Outcome: Progressing   Problem: Elimination: Goal: Will not experience complications related to bowel motility Outcome: Progressing   Problem: Pain Managment: Goal: General experience of comfort will improve Outcome: Progressing   Problem: Safety: Goal: Ability to remain free from injury will improve Outcome: Progressing   Problem: Skin Integrity: Goal: Risk for impaired skin integrity will decrease Outcome: Progressing   

## 2019-07-18 NOTE — Progress Notes (Signed)
Inpatient Rehab Admissions:  Inpatient Rehab Consult received.  I met with pt at the bedside for rehabilitation assessment. Noted pt is still pending surgery Monday and it is still in quite a bit of pain. Pt aware of his need for rehab and is interested in CIR program but would like to discuss with his family. He has concerns regarding cost as he is uninsured as well as his home environment as he lives in a second story apartment but is currently NWB BLEs. He requested I speak with his family regarding these issues as well. AC will need to confirm family support, safe DC location (preferably a location with less stairs), and continue to follow to see what his WB status will be after surgery Monday as that will determine his LOS on rehab as well as narrow down what his housing accommodations will need to be. Will get in touch with financial counselor to assist with this case as well.   Raechel Ache, OTR/L  Rehab Admissions Coordinator  [phone] 07/18/2019 2:09 PM
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
## 2019-07-18 NOTE — Progress Notes (Signed)
Pt refused CPM at this time stating he will do it later.
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
## 2019-07-18 NOTE — Progress Notes (Signed)
Orthopedic Tech Progress Note Patient Details:  BRANDN MCGATH June 01, 1985 [id number] Patient refused the CPM. Said he felt like he was getting ready to pass out from pain. I did ask THERAPY to come and help just to make sure things was being done right but patient just was not feeling it. York Spaniel he would try again later. Left machine in room on countertop. I did notify the RN. Patient ID: KALVYN DESA, male   DOB: 1985-05-01, 34 y.o.   MRN: [id number]   Donald Pore 07/18/2019, 11:35 AM

## 2019-07-18 NOTE — Progress Notes (Signed)
Orthopedic Tech Progress Note Patient Details:  Tristan White June 05, 1985 [id number] Patient let me apply the CPM after we got him back into bed. CPM Right Knee CPM Right Knee: On Right Knee Flexion (Degrees): 0 Right Knee Extension (Degrees): 40 Additional Comments: added ice  Post Interventions Patient Tolerated: Well Instructions Provided: Care of device  Donald Pore 07/18/2019, 6:56 PM

## 2019-07-19 LAB — BPAM RBC
Blood Product Expiration Date: 202105152359
Blood Product Expiration Date: 202105152359
Blood Product Expiration Date: 202105252359
ISSUE DATE / TIME: 202104261031
Unit Type and Rh: 7300
Unit Type and Rh: 7300
Unit Type and Rh: 7300

## 2019-07-19 LAB — COMPREHENSIVE METABOLIC PANEL
ALT: 49 U/L — ABNORMAL HIGH (ref 0–44)
AST: 66 U/L — ABNORMAL HIGH (ref 15–41)
Albumin: 2.9 g/dL — ABNORMAL LOW (ref 3.5–5.0)
Alkaline Phosphatase: 82 U/L (ref 38–126)
Anion gap: 9 (ref 5–15)
BUN: 11 mg/dL (ref 6–20)
CO2: 26 mmol/L (ref 22–32)
Calcium: 8.5 mg/dL — ABNORMAL LOW (ref 8.9–10.3)
Chloride: 102 mmol/L (ref 98–111)
Creatinine, Ser: 0.71 mg/dL (ref 0.61–1.24)
GFR calc Af Amer: >60 mL/min (ref >60)
GFR calc non Af Amer: >60 mL/min (ref >60)
Glucose, Bld: 126 mg/dL — ABNORMAL HIGH (ref 70–99)
Potassium: 3.7 mmol/L (ref 3.5–5.1)
Sodium: 137 mmol/L (ref 135–145)
Total Bilirubin: 0.8 mg/dL (ref 0.3–1.2)
Total Protein: 6.2 g/dL — ABNORMAL LOW (ref 6.5–8.1)

## 2019-07-19 LAB — TYPE AND SCREEN
ABO/RH(D): B POS
Antibody Screen: NEGATIVE
Unit division: 0
Unit division: 0
Unit division: 0

## 2019-07-19 LAB — CBC
HCT: 25.8 % — ABNORMAL LOW (ref 39.0–52.0)
Hemoglobin: 8.7 g/dL — ABNORMAL LOW (ref 13.0–17.0)
MCH: 30.2 pg (ref 26.0–34.0)
MCHC: 33.7 g/dL (ref 30.0–36.0)
MCV: 89.6 fL (ref 80.0–100.0)
Platelets: 410 10*3/uL — ABNORMAL HIGH (ref 150–400)
RBC: 2.88 MIL/uL — ABNORMAL LOW (ref 4.22–5.81)
RDW: 12.3 % (ref 11.5–15.5)
WBC: 9.5 10*3/uL (ref 4.0–10.5)
nRBC: 0 % (ref 0.0–0.2)

## 2019-07-19 LAB — TRIGLYCERIDES: Triglycerides: 87 mg/dL (ref <150)

## 2019-07-19 LAB — GLUCOSE, CAPILLARY
Glucose-Capillary: 141 mg/dL — ABNORMAL HIGH (ref 70–99)
Glucose-Capillary: 124 mg/dL — ABNORMAL HIGH (ref 70–99)
Glucose-Capillary: 105 mg/dL — ABNORMAL HIGH (ref 70–99)
Glucose-Capillary: 153 mg/dL — ABNORMAL HIGH (ref 70–99)

## 2019-07-19 MED ORDER — MELATONIN 3 MG PO TABS
3.0000 mg | ORAL_TABLET | Freq: Every day | ORAL | Status: DC
  Administered 2019-07-19 – 2019-07-24 (×6): 3 mg via ORAL
  Filled 2019-07-19 (×6): qty 1
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
## 2019-07-19 NOTE — Plan of Care (Signed)

## 2019-07-19 NOTE — Plan of Care (Signed)

## 2019-07-20 LAB — GLUCOSE, CAPILLARY
Glucose-Capillary: 105 mg/dL — ABNORMAL HIGH (ref 70–99)
Glucose-Capillary: 95 mg/dL (ref 70–99)
Glucose-Capillary: 106 mg/dL — ABNORMAL HIGH (ref 70–99)
Glucose-Capillary: 113 mg/dL — ABNORMAL HIGH (ref 70–99)

## 2019-07-20 MED ORDER — ENOXAPARIN SODIUM 30 MG/0.3ML ~~LOC~~ SOLN
30.0000 mg | Freq: Two times a day (BID) | SUBCUTANEOUS | Status: DC
  Administered 2019-07-20 – 2019-07-21 (×2): 30 mg via SUBCUTANEOUS
  Filled 2019-07-20 (×3): qty 0.3

## 2019-07-20 NOTE — Plan of Care (Signed)
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
## 2019-07-21 LAB — GLUCOSE, CAPILLARY: Glucose-Capillary: 109 mg/dL — ABNORMAL HIGH (ref 70–99)
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
## 2019-07-21 NOTE — Plan of Care (Signed)

## 2019-07-21 NOTE — Plan of Care (Signed)
  Problem: Coping: Goal: Level of anxiety will decrease Outcome: Progressing   Problem: Pain Managment: Goal: General experience of comfort will improve Outcome: Progressing   Problem: Safety: Goal: Ability to remain free from injury will improve Outcome: Progressing   Problem: Skin Integrity: Goal: Risk for impaired skin integrity will decrease Outcome: Progressing   

## 2019-07-21 NOTE — Plan of Care (Signed)

## 2019-07-22 ENCOUNTER — Encounter (HOSPITAL_COMMUNITY): Payer: Self-pay

## 2019-07-22 ENCOUNTER — Inpatient Hospital Stay (HOSPITAL_COMMUNITY): Payer: No Typology Code available for payment source | Admitting: Anesthesiology

## 2019-07-22 ENCOUNTER — Encounter (HOSPITAL_COMMUNITY): Admission: EM | Disposition: A | Discharge status: Rehabilitation Facility | Payer: Self-pay | Source: Home / Self Care

## 2019-07-22 DIAGNOSIS — S82141D Displaced bicondylar fracture of right tibia, subsequent encounter for closed fracture with routine healing: Secondary | ICD-10-CM

## 2019-07-22 DIAGNOSIS — S8991XD Unspecified injury of right lower leg, subsequent encounter: Secondary | ICD-10-CM

## 2019-07-22 DIAGNOSIS — S83511D Sprain of anterior cruciate ligament of right knee, subsequent encounter: Secondary | ICD-10-CM

## 2019-07-22 HISTORY — PX: ANTERIOR CRUCIATE LIGAMENT REPAIR: SHX115

## 2019-07-22 LAB — CREATININE, SERUM
Creatinine, Ser: 0.83 mg/dL (ref 0.61–1.24)
GFR calc Af Amer: >60 mL/min (ref >60)
GFR calc non Af Amer: >60 mL/min (ref >60)

## 2019-07-22 LAB — CBC
HCT: 30.8 % — ABNORMAL LOW (ref 39.0–52.0)
HCT: 28.9 % — ABNORMAL LOW (ref 39.0–52.0)
Hemoglobin: 10.7 g/dL — ABNORMAL LOW (ref 13.0–17.0)
Hemoglobin: 9.9 g/dL — ABNORMAL LOW (ref 13.0–17.0)
MCH: 31.4 pg (ref 26.0–34.0)
MCH: 31.3 pg (ref 26.0–34.0)
MCHC: 34.7 g/dL (ref 30.0–36.0)
MCHC: 34.3 g/dL (ref 30.0–36.0)
MCV: 90.3 fL (ref 80.0–100.0)
MCV: 91.5 fL (ref 80.0–100.0)
Platelets: 826 10*3/uL — ABNORMAL HIGH (ref 150–400)
Platelets: 852 10*3/uL — ABNORMAL HIGH (ref 150–400)
RBC: 3.41 MIL/uL — ABNORMAL LOW (ref 4.22–5.81)
RBC: 3.16 MIL/uL — ABNORMAL LOW (ref 4.22–5.81)
RDW: 13.9 % (ref 11.5–15.5)
RDW: 14.1 % (ref 11.5–15.5)
WBC: 10.1 10*3/uL (ref 4.0–10.5)
WBC: 12.8 10*3/uL — ABNORMAL HIGH (ref 4.0–10.5)
nRBC: 0 % (ref 0.0–0.2)
nRBC: 0 % (ref 0.0–0.2)

## 2019-07-22 LAB — TRIGLYCERIDES: Triglycerides: 73 mg/dL (ref <150)

## 2019-07-22 LAB — BASIC METABOLIC PANEL
Anion gap: 12 (ref 5–15)
BUN: 16 mg/dL (ref 6–20)
CO2: 24 mmol/L (ref 22–32)
Calcium: 9.6 mg/dL (ref 8.9–10.3)
Chloride: 98 mmol/L (ref 98–111)
Creatinine, Ser: 0.78 mg/dL (ref 0.61–1.24)
GFR calc Af Amer: >60 mL/min (ref >60)
GFR calc non Af Amer: >60 mL/min (ref >60)
Glucose, Bld: 114 mg/dL — ABNORMAL HIGH (ref 70–99)
Potassium: 4.1 mmol/L (ref 3.5–5.1)
Sodium: 134 mmol/L — ABNORMAL LOW (ref 135–145)

## 2019-07-22 SURGERY — RECONSTRUCTION, KNEE, ACL, USING HAMSTRING GRAFT
Anesthesia: General | Site: Knee | Laterality: Right

## 2019-07-22 MED ORDER — SODIUM CHLORIDE 0.9 % IR SOLN
Status: DC | PRN
  Administered 2019-07-22: 3000 mL

## 2019-07-22 MED ORDER — METHOCARBAMOL 1000 MG/10ML IJ SOLN
500.0000 mg | Freq: Four times a day (QID) | INTRAVENOUS | Status: DC | PRN
  Filled 2019-07-22: qty 5

## 2019-07-22 MED ORDER — 0.9 % SODIUM CHLORIDE (POUR BTL) OPTIME
TOPICAL | Status: DC | PRN
  Administered 2019-07-22: 2000 mL
  Administered 2019-07-22 (×2): 1000 mL

## 2019-07-22 MED ORDER — MORPHINE SULFATE (PF) 4 MG/ML IV SOLN
INTRAVENOUS | Status: DC | PRN
  Administered 2019-07-22: 8 mg

## 2019-07-22 MED ORDER — CLONIDINE HCL (ANALGESIA) 100 MCG/ML EP SOLN
EPIDURAL | Status: DC | PRN
  Administered 2019-07-22: 1 mL

## 2019-07-22 MED ORDER — BUPIVACAINE HCL 0.25 % IJ SOLN
INTRAMUSCULAR | Status: DC | PRN
  Administered 2019-07-22: 30 mL
  Administered 2019-07-22: 5 mL

## 2019-07-22 MED ORDER — ROCURONIUM BROMIDE 10 MG/ML (PF) SYRINGE
PREFILLED_SYRINGE | INTRAVENOUS | Status: DC | PRN
  Administered 2019-07-22: 90 mg via INTRAVENOUS

## 2019-07-22 MED ORDER — CEFAZOLIN SODIUM-DEXTROSE 2-4 GM/100ML-% IV SOLN
2.0000 g | Freq: Three times a day (TID) | INTRAVENOUS | Status: AC
  Administered 2019-07-23 (×3): 2 g via INTRAVENOUS
  Filled 2019-07-22 (×3): qty 100

## 2019-07-22 MED ORDER — FENTANYL CITRATE (PF) 100 MCG/2ML IJ SOLN: 100.0000 ug | Freq: Once | INTRAMUSCULAR | Status: AC

## 2019-07-22 MED ORDER — MORPHINE SULFATE (PF) 4 MG/ML IV SOLN
INTRAVENOUS | Status: AC
  Filled 2019-07-22: qty 2

## 2019-07-22 MED ORDER — ZOLPIDEM TARTRATE 5 MG PO TABS
5.0000 mg | ORAL_TABLET | Freq: Every evening | ORAL | Status: DC | PRN
  Administered 2019-07-22 – 2019-07-24 (×3): 5 mg via ORAL
  Filled 2019-07-22 (×3): qty 1

## 2019-07-22 MED ORDER — CEFAZOLIN SODIUM-DEXTROSE 2-4 GM/100ML-% IV SOLN
2.0000 g | Freq: Once | INTRAVENOUS | Status: AC
  Administered 2019-07-22 (×2): 2 g via INTRAVENOUS

## 2019-07-22 MED ORDER — METOCLOPRAMIDE HCL 5 MG PO TABS: 5.0000 mg | ORAL_TABLET | Freq: Three times a day (TID) | ORAL | Status: DC | PRN

## 2019-07-22 MED ORDER — FENTANYL CITRATE (PF) 250 MCG/5ML IJ SOLN
INTRAMUSCULAR | Status: DC | PRN
  Administered 2019-07-22: 50 ug via INTRAVENOUS

## 2019-07-22 MED ORDER — MIDAZOLAM HCL 2 MG/2ML IJ SOLN
INTRAMUSCULAR | Status: AC
  Filled 2019-07-22: qty 2

## 2019-07-22 MED ORDER — EPINEPHRINE PF 1 MG/ML IJ SOLN
INTRAMUSCULAR | Status: DC | PRN
  Administered 2019-07-22: .15 mL

## 2019-07-22 MED ORDER — PROPOFOL 10 MG/ML IV BOLUS
INTRAVENOUS | Status: AC
  Filled 2019-07-22: qty 20

## 2019-07-22 MED ORDER — SUFENTANIL CITRATE 50 MCG/ML IV SOLN
INTRAVENOUS | Status: DC | PRN
  Administered 2019-07-22 (×2): 5 ug via INTRAVENOUS
  Administered 2019-07-22: 25 ug via INTRAVENOUS
  Administered 2019-07-22: 10 ug via INTRAVENOUS
  Administered 2019-07-22: 5 ug via INTRAVENOUS

## 2019-07-22 MED ORDER — VANCOMYCIN HCL 1000 MG IV SOLR
INTRAVENOUS | Status: AC
  Filled 2019-07-22: qty 1000

## 2019-07-22 MED ORDER — METHOCARBAMOL 500 MG PO TABS
500.0000 mg | ORAL_TABLET | Freq: Four times a day (QID) | ORAL | Status: DC | PRN
  Administered 2019-07-22: 500 mg via ORAL
  Filled 2019-07-22: qty 1

## 2019-07-22 MED ORDER — ROPIVACAINE HCL 7.5 MG/ML IJ SOLN
INTRAMUSCULAR | Status: DC | PRN
  Administered 2019-07-22: 25 mL via PERINEURAL

## 2019-07-22 MED ORDER — SODIUM CHLORIDE (PF) 0.9 % IJ SOLN
INTRAMUSCULAR | Status: AC
  Filled 2019-07-22: qty 10

## 2019-07-22 MED ORDER — HYDROMORPHONE HCL 1 MG/ML IJ SOLN
INTRAMUSCULAR | Status: AC
  Filled 2019-07-22: qty 1

## 2019-07-22 MED ORDER — CEFAZOLIN SODIUM-DEXTROSE 2-4 GM/100ML-% IV SOLN
INTRAVENOUS | Status: AC
  Filled 2019-07-22: qty 100

## 2019-07-22 MED ORDER — CLONIDINE HCL (ANALGESIA) 100 MCG/ML EP SOLN
EPIDURAL | Status: AC
  Filled 2019-07-22: qty 10

## 2019-07-22 MED ORDER — PROMETHAZINE HCL 25 MG/ML IJ SOLN: 6.2500 mg | INTRAMUSCULAR | Status: DC | PRN

## 2019-07-22 MED ORDER — VANCOMYCIN HCL 1000 MG IV SOLR
INTRAVENOUS | Status: DC | PRN
  Administered 2019-07-22: 1000 mg

## 2019-07-22 MED ORDER — SUFENTANIL CITRATE 50 MCG/ML IV SOLN
INTRAVENOUS | Status: AC
  Filled 2019-07-22: qty 1

## 2019-07-22 MED ORDER — TRANEXAMIC ACID-NACL 1000-0.7 MG/100ML-% IV SOLN
INTRAVENOUS | Status: DC | PRN
Start: 2019-07-22 — End: 2019-07-22
  Administered 2019-07-22: 1000 mg via INTRAVENOUS

## 2019-07-22 MED ORDER — SODIUM CHLORIDE 0.9 % IR SOLN
Status: DC | PRN
  Administered 2019-07-22: 12000 mL

## 2019-07-22 MED ORDER — EPINEPHRINE PF 1 MG/ML IJ SOLN
INTRAMUSCULAR | Status: AC
  Filled 2019-07-22: qty 4

## 2019-07-22 MED ORDER — FENTANYL CITRATE (PF) 250 MCG/5ML IJ SOLN
INTRAMUSCULAR | Status: AC
  Filled 2019-07-22: qty 5

## 2019-07-22 MED ORDER — ONDANSETRON HCL 4 MG PO TABS: 4.0000 mg | ORAL_TABLET | Freq: Four times a day (QID) | ORAL | Status: DC | PRN

## 2019-07-22 MED ORDER — BUPIVACAINE HCL (PF) 0.25 % IJ SOLN
INTRAMUSCULAR | Status: AC
  Filled 2019-07-22: qty 30

## 2019-07-22 MED ORDER — LIDOCAINE 2% (20 MG/ML) 5 ML SYRINGE
INTRAMUSCULAR | Status: AC
  Filled 2019-07-22: qty 5

## 2019-07-22 MED ORDER — DOCUSATE SODIUM 100 MG PO CAPS: 100.0000 mg | ORAL_CAPSULE | Freq: Two times a day (BID) | ORAL | Status: DC

## 2019-07-22 MED ORDER — LACTATED RINGERS IV SOLN: INTRAVENOUS | Status: AC

## 2019-07-22 MED ORDER — MIDAZOLAM HCL 2 MG/2ML IJ SOLN: 2.0000 mg | Freq: Once | INTRAMUSCULAR | Status: AC

## 2019-07-22 MED ORDER — PROPOFOL 10 MG/ML IV BOLUS
INTRAVENOUS | Status: DC | PRN
  Administered 2019-07-22: 120 mg via INTRAVENOUS

## 2019-07-22 MED ORDER — DEXAMETHASONE SODIUM PHOSPHATE 10 MG/ML IJ SOLN
INTRAMUSCULAR | Status: DC | PRN
Start: 2019-07-22 — End: 2019-07-22
  Administered 2019-07-22: 10 mg

## 2019-07-22 MED ORDER — MIDAZOLAM HCL 2 MG/2ML IJ SOLN
INTRAMUSCULAR | Status: AC
  Administered 2019-07-22: 2 mg via INTRAVENOUS
  Filled 2019-07-22: qty 2

## 2019-07-22 MED ORDER — EPINEPHRINE PF 1 MG/ML IJ SOLN
INTRAMUSCULAR | Status: AC
  Filled 2019-07-22: qty 1

## 2019-07-22 MED ORDER — ONDANSETRON HCL 4 MG/2ML IJ SOLN: 4.0000 mg | Freq: Four times a day (QID) | INTRAMUSCULAR | Status: DC | PRN

## 2019-07-22 MED ORDER — ONDANSETRON HCL 4 MG/2ML IJ SOLN
INTRAMUSCULAR | Status: AC
  Filled 2019-07-22: qty 2

## 2019-07-22 MED ORDER — FENTANYL CITRATE (PF) 100 MCG/2ML IJ SOLN
INTRAMUSCULAR | Status: AC
  Administered 2019-07-22: 100 ug via INTRAVENOUS
  Filled 2019-07-22: qty 2

## 2019-07-22 MED ORDER — HYDROMORPHONE HCL 1 MG/ML IJ SOLN
0.2500 mg | INTRAMUSCULAR | Status: DC | PRN
  Administered 2019-07-22 (×3): 0.5 mg via INTRAVENOUS

## 2019-07-22 MED ORDER — ONDANSETRON HCL 4 MG/2ML IJ SOLN
INTRAMUSCULAR | Status: DC | PRN
  Administered 2019-07-22: 4 mg via INTRAVENOUS

## 2019-07-22 MED ORDER — ENOXAPARIN SODIUM 40 MG/0.4ML ~~LOC~~ SOLN
40.0000 mg | SUBCUTANEOUS | Status: DC
  Administered 2019-07-23: 40 mg via SUBCUTANEOUS
  Filled 2019-07-22: qty 0.4

## 2019-07-22 MED ORDER — LIDOCAINE 2% (20 MG/ML) 5 ML SYRINGE
INTRAMUSCULAR | Status: DC | PRN
  Administered 2019-07-22: 100 mg via INTRAVENOUS

## 2019-07-22 MED ORDER — TRANEXAMIC ACID-NACL 1000-0.7 MG/100ML-% IV SOLN
INTRAVENOUS | Status: AC
  Filled 2019-07-22: qty 100

## 2019-07-22 MED ORDER — METOCLOPRAMIDE HCL 5 MG/ML IJ SOLN: 5.0000 mg | Freq: Three times a day (TID) | INTRAMUSCULAR | Status: DC | PRN

## 2019-07-22 SURGICAL SUPPLY — 112 items
ALCOHOL 70% 16 OZ (MISCELLANEOUS) ×3 IMPLANT
ANCHOR PEEK CORKSCREW 4.5 (Anchor) ×3 IMPLANT
ANCHOR PEEK CORKSCREW 4.5MM (Anchor) ×3 IMPLANT
ANCHOR PEEK SWIVEL LOCK 5.5 (Anchor) ×4 IMPLANT
ANCHOR SUT 1.8 FBRTK KNTLS 2SU (Anchor) ×2 IMPLANT
ANCHOR SUT BIO SW 4.75X19.1 (Anchor) ×6 IMPLANT
BANDAGE ESMARK 6X9 LF (GAUZE/BANDAGES/DRESSINGS) IMPLANT
BLADE SURG 10 STRL SS (BLADE) ×3 IMPLANT
BLADE SURG 15 STRL LF DISP TIS (BLADE) ×2 IMPLANT
BLADE SURG 15 STRL SS (BLADE) ×6
BNDG ELASTIC 4X5.8 VLCR STR LF (GAUZE/BANDAGES/DRESSINGS) ×2 IMPLANT
BNDG ELASTIC 6X10 VLCR STRL LF (GAUZE/BANDAGES/DRESSINGS) ×2 IMPLANT
BNDG ELASTIC 6X15 VLCR STRL LF (GAUZE/BANDAGES/DRESSINGS) ×3 IMPLANT
BNDG ESMARK 6X9 LF (GAUZE/BANDAGES/DRESSINGS)
BURR OVAL 8 FLU 4.0MM X 13CM (MISCELLANEOUS)
BURR OVAL 8 FLU 4.0X13 (MISCELLANEOUS) IMPLANT
CLOSURE STERI-STRIP 1/2X4 (GAUZE/BANDAGES/DRESSINGS) ×2
CLOSURE WOUND 1/2 X4 (GAUZE/BANDAGES/DRESSINGS) ×1
CLSR STERI-STRIP ANTIMIC 1/2X4 (GAUZE/BANDAGES/DRESSINGS) ×2 IMPLANT
COVER MAYO STAND STRL (DRAPES) ×3 IMPLANT
COVER SURGICAL LIGHT HANDLE (MISCELLANEOUS) ×3 IMPLANT
COVER WAND RF STERILE (DRAPES) ×3 IMPLANT
CUFF TOURN SGL QUICK 34 (TOURNIQUET CUFF)
CUFF TRNQT CYL 34X4.125X (TOURNIQUET CUFF) IMPLANT
DECANTER SPIKE VIAL GLASS SM (MISCELLANEOUS) ×3 IMPLANT
DISSECTOR 4.0MM X 13CM (MISCELLANEOUS) ×3 IMPLANT
DRAPE ARTHROSCOPY W/POUCH 114 (DRAPES) ×3 IMPLANT
DRAPE INCISE IOBAN 66X45 STRL (DRAPES) ×5 IMPLANT
DRAPE OEC MINIVIEW 54X84 (DRAPES) IMPLANT
DRAPE ORTHO SPLIT 77X108 STRL (DRAPES) ×2
DRAPE SURG ORHT 6 SPLT 77X108 (DRAPES) ×1 IMPLANT
DRAPE U-SHAPE 47X51 STRL (DRAPES) ×3 IMPLANT
DRILL FLIPCUTTER II 7.5MM (MISCELLANEOUS) IMPLANT
DRILL FLIPCUTTER II 8.0MM (INSTRUMENTS) IMPLANT
DRILL FLIPCUTTER II 8.5MM (INSTRUMENTS) IMPLANT
DRILL FLIPCUTTER II 9.0MM (INSTRUMENTS) IMPLANT
DRSG AQUACEL AG ADV 3.5X10 (GAUZE/BANDAGES/DRESSINGS) ×2 IMPLANT
DRSG PAD ABDOMINAL 8X10 ST (GAUZE/BANDAGES/DRESSINGS) ×3 IMPLANT
DRSG TEGADERM 4X4.5 CHG (GAUZE/BANDAGES/DRESSINGS) ×4 IMPLANT
DRSG TEGADERM 4X4.75 (GAUZE/BANDAGES/DRESSINGS) ×6 IMPLANT
DRSG XEROFORM 1X8 (GAUZE/BANDAGES/DRESSINGS) ×2 IMPLANT
DURAPREP 26ML APPLICATOR (WOUND CARE) ×6 IMPLANT
DW OUTFLOW CASSETTE/TUBE SET (MISCELLANEOUS) ×3 IMPLANT
ELECT REM PT RETURN 9FT ADLT (ELECTROSURGICAL) ×3
ELECTRODE REM PT RTRN 9FT ADLT (ELECTROSURGICAL) ×1 IMPLANT
FLIPCUTTER II 7.5MM (MISCELLANEOUS)
FLIPCUTTER II 8.0MM (INSTRUMENTS)
FLIPCUTTER II 8.5MM (INSTRUMENTS)
FLIPCUTTER II 9.0MM (INSTRUMENTS)
GAUZE SPONGE 4X4 12PLY STRL (GAUZE/BANDAGES/DRESSINGS) ×2 IMPLANT
GAUZE SPONGE 4X4 12PLY STRL LF (GAUZE/BANDAGES/DRESSINGS) ×3 IMPLANT
GAUZE XEROFORM 1X8 LF (GAUZE/BANDAGES/DRESSINGS) ×4 IMPLANT
GLOVE BIOGEL PI IND STRL 8 (GLOVE) ×1 IMPLANT
GLOVE BIOGEL PI INDICATOR 8 (GLOVE) ×2
GLOVE ECLIPSE 8.0 STRL XLNG CF (GLOVE) ×3 IMPLANT
GLOVE ORTHO TXT STRL SZ7.5 (GLOVE) ×3 IMPLANT
GOWN STRL REUS W/ TWL LRG LVL3 (GOWN DISPOSABLE) ×3 IMPLANT
GOWN STRL REUS W/TWL LRG LVL3 (GOWN DISPOSABLE) ×6
GRAFT TISS SEMITEND 4-8 (Bone Implant) IMPLANT
HARVESTER TENDON QUADPRO 10 (ORTHOPEDIC DISPOSABLE SUPPLIES) ×2 IMPLANT
IMMOBILIZER KNEE 22 UNIV (SOFTGOODS) ×3 IMPLANT
IMP SYS 2ND FIX PEEK 4.75X19.1 (Miscellaneous) ×3 IMPLANT
IMPL SYS 2ND FX PEEK 4.75X19.1 (Miscellaneous) IMPLANT
IMPL TIGHTROP ABS ACL FIBERTG (Orthopedic Implant) IMPLANT
IMPL TIGHTROP FIBERTAG ACL (Orthopedic Implant) IMPLANT
IMPL TIGHTROPE ABS ACL FIBERTG (Orthopedic Implant) ×3 IMPLANT
IMPLANT TIGHTROPE FIBERTAG ACL (Orthopedic Implant) ×3 IMPLANT
KIT BASIN OR (CUSTOM PROCEDURE TRAY) ×3 IMPLANT
KIT BIOCARTILAGE DEL W/SYRINGE (KITS) IMPLANT
KIT BIOCARTILAGE LG JOINT MIX (KITS) ×3 IMPLANT
KIT BUTTON TIGHTROPE ABS 8X12 (Anchor) ×2 IMPLANT
KIT STR SPEAR 1.8 FBRTK DISP (KITS) ×2 IMPLANT
KIT TURNOVER KIT B (KITS) ×3 IMPLANT
MANIFOLD NEPTUNE II (INSTRUMENTS) ×3 IMPLANT
NDL 18GX1X1/2 (RX/OR ONLY) (NEEDLE) ×1 IMPLANT
NDL HYPO 18GX1.5 BLUNT FILL (NEEDLE) ×1 IMPLANT
NEEDLE 18GX1X1/2 (RX/OR ONLY) (NEEDLE) ×3 IMPLANT
NEEDLE HYPO 18GX1.5 BLUNT FILL (NEEDLE) ×3 IMPLANT
NS IRRIG 1000ML POUR BTL (IV SOLUTION) ×3 IMPLANT
PACK ARTHROSCOPY DSU (CUSTOM PROCEDURE TRAY) ×3 IMPLANT
PAD ARMBOARD 7.5X6 YLW CONV (MISCELLANEOUS) ×6 IMPLANT
PAD CAST 4YDX4 CTTN HI CHSV (CAST SUPPLIES) ×1 IMPLANT
PADDING CAST COTTON 4X4 STRL (CAST SUPPLIES) ×2
PADDING CAST COTTON 6X4 STRL (CAST SUPPLIES) ×4 IMPLANT
PENCIL BUTTON HOLSTER BLD 10FT (ELECTRODE) ×3 IMPLANT
PROBE APOLLO 90XL (SURGICAL WAND) ×2 IMPLANT
PUTTY DBM ALLOSYNC PURE 5CC (Putty) ×2 IMPLANT
SPONGE LAP 18X18 RF (DISPOSABLE) ×3 IMPLANT
SPONGE LAP 4X18 RFD (DISPOSABLE) ×6 IMPLANT
STRIP CLOSURE SKIN 1/2X4 (GAUZE/BANDAGES/DRESSINGS) ×2 IMPLANT
SUCTION FRAZIER HANDLE 10FR (MISCELLANEOUS) ×2
SUCTION TUBE FRAZIER 10FR DISP (MISCELLANEOUS) ×1 IMPLANT
SUT ETHILON 3 0 PS 1 (SUTURE) ×8 IMPLANT
SUT MNCRL AB 3-0 PS2 18 (SUTURE) ×3 IMPLANT
SUT VIC AB 0 CT1 27 (SUTURE) ×4
SUT VIC AB 0 CT1 27XBRD ANBCTR (SUTURE) ×1 IMPLANT
SUT VIC AB 1 CT1 27 (SUTURE) ×10
SUT VIC AB 1 CT1 27XBRD ANBCTR (SUTURE) IMPLANT
SUT VIC AB 2-0 CT1 27 (SUTURE) ×6
SUT VIC AB 2-0 CT1 TAPERPNT 27 (SUTURE) ×1 IMPLANT
SUT VICRYL 0 UR6 27IN ABS (SUTURE) ×7 IMPLANT
SYR 30ML LL (SYRINGE) ×3 IMPLANT
SYR 3ML LL SCALE MARK (SYRINGE) ×3 IMPLANT
SYR BULB IRRIGATION 50ML (SYRINGE) ×3 IMPLANT
SYR TB 1ML LUER SLIP (SYRINGE) ×3 IMPLANT
TENDON SEMI-TENDINOSUS (Bone Implant) ×3 IMPLANT
TOWEL GREEN STERILE (TOWEL DISPOSABLE) ×3 IMPLANT
TOWEL GREEN STERILE FF (TOWEL DISPOSABLE) ×3 IMPLANT
TUBING ARTHROSCOPY IRRIG 16FT (MISCELLANEOUS) ×3 IMPLANT
UNDERPAD 30X30 (UNDERPADS AND DIAPERS) ×3 IMPLANT
WRAP KNEE MAXI GEL POST OP (GAUZE/BANDAGES/DRESSINGS) ×3 IMPLANT
YANKAUER SUCT BULB TIP NO VENT (SUCTIONS) ×3 IMPLANT

## 2019-07-22 NOTE — Progress Notes (Signed)
OT Cancellation Note  Patient Details Name: Tristan White MRN: [id number] DOB: 1985/11/28   Cancelled Treatment:    Reason Eval/Treat Not Completed: Patient at procedure or test/ unavailable Pt at OR for R knee surgery. OT will follow and return as time allows and pt is appropriate.   [facility] OTR/L Acute Rehabilitation Services Office: [phone]   Rebeca Alert 07/22/2019, 11:43 AM

## 2019-07-22 NOTE — Anesthesia Procedure Notes (Signed)
Anesthesia Regional Block: Adductor canal block   Pre-Anesthetic Checklist: ,, timeout performed, Correct Patient, Correct Site, Correct Laterality, Correct Procedure, Correct Position, site marked, Risks and benefits discussed,  Surgical consent,  Pre-op evaluation,  At surgeon's request and post-op pain management  Laterality: Right  Prep: chloraprep       Needles:  Injection technique: Single-shot  Needle Type: Echogenic Stimulator Needle     Needle Length: 5cm  Needle Gauge: 22     Additional Needles:   Procedures:, nerve stimulator,,, ultrasound used (permanent image in chart),,,,  Narrative:  Injection made incrementally with aspirations every 5 mL.  Performed by: Personally  Anesthesiologist: Bethena Midget, MD  Additional Notes: Functioning IV was confirmed and monitors were applied.  A 81mm 22ga Arrow echogenic stimulator needle was used. Sterile prep and drape,hand hygiene and sterile gloves were used. Ultrasound guidance: relevant anatomy identified, needle position confirmed, local anesthetic spread visualized around nerve(s)., vascular puncture avoided.  Image printed for medical record. Negative aspiration and negative test dose prior to incremental administration of local anesthetic. The patient tolerated the procedure well.

## 2019-07-22 NOTE — Anesthesia Procedure Notes (Signed)
Procedure Name: Intubation Date/Time: 07/22/2019 1:11 PM Performed by: Moshe Salisbury, CRNA Pre-anesthesia Checklist: Patient identified, Emergency Drugs available, Suction available and Patient being monitored Patient Re-evaluated:Patient Re-evaluated prior to induction Oxygen Delivery Method: Circle System Utilized Preoxygenation: Pre-oxygenation with 100% oxygen Induction Type: IV induction Ventilation: Mask ventilation without difficulty Laryngoscope Size: Mac and 4 Grade View: Grade I Tube type: Oral Tube size: 7.5 mm Number of attempts: 1 Airway Equipment and Method: Stylet and Oral airway Placement Confirmation: ETT inserted through vocal cords under direct vision,  positive ETCO2 and breath sounds checked- equal and bilateral Secured at: 21 cm Tube secured with: Tape Dental Injury: Teeth and Oropharynx as per pre-operative assessment

## 2019-07-22 NOTE — Brief Op Note (Signed)
   07/22/2019  6:24 PM  PATIENT:  Tristan White  34 y.o. male  PRE-OPERATIVE DIAGNOSIS:  right knee multiligament injury  POST-OPERATIVE DIAGNOSIS:  right knee multiligament injury  PROCEDURE:  Procedure(s): RIGHT KNEE ANTERIOR CRUCIATE LIGAMENT (ACL) RECONSTRUCTION, QUAD  AUTOGRAFT, lcl reconstruction/repair POSTEROLATERAL CORNER RECONSTRUCTION, peroneal nerve neurolysis, open reduction internal fixation of tibial plateau fracture and involving Gerdys tubercle SURGEON:  Surgeon(s): August Saucer, Corrie Mckusick, MD  ASSISTANT: Karenann Cai, PA  ANESTHESIA:   general  EBL: 100 ml    Total I/O In: 1300 [I.V.:1000; IV Piggyback:300] Out: 100 [Blood:100]  BLOOD ADMINISTERED: none  DRAINS: none   LOCAL MEDICATIONS USED: Marcaine morphine clonidine injected into the knee along with vancomycin powder topically lateral incision and in the knee  SPECIMEN:  No Specimen  COUNTS:  YES  TOURNIQUET:   Total Tourniquet Time Documented: Thigh (Right) - 15 minutes Thigh (Right) - 104 minutes Total: Thigh (Right) - 119 minutes   DICTATION: .Other Dictation: Dictation Number [id number]  PLAN OF CARE: Admit to inpatient   PATIENT DISPOSITION:  PACU - hemodynamically stable

## 2019-07-22 NOTE — Anesthesia Preprocedure Evaluation (Addendum)
Anesthesia Evaluation  Patient identified by MRN, date of birth, ID band Patient awake    Reviewed: Allergy & Precautions, NPO status , Patient's Chart, lab work & pertinent test results  Airway Mallampati: I  TM Distance: >3 FB Neck ROM: Full    Dental  (+) Teeth Intact, Dental Advisory Given   Pulmonary asthma , Current Smoker,    Pulmonary exam normal        Cardiovascular negative cardio ROS Normal cardiovascular exam     Neuro/Psych negative neurological ROS     GI/Hepatic negative GI ROS, Neg liver ROS,   Endo/Other  negative endocrine ROS  Renal/GU negative Renal ROS     Musculoskeletal negative musculoskeletal ROS (+)   Abdominal   Peds  Hematology negative hematology ROS (+)   Anesthesia Other Findings Day of surgery medications reviewed with the patient.  Reproductive/Obstetrics                          Anesthesia Physical Anesthesia Plan  ASA: II  Anesthesia Plan: General   Post-op Pain Management: GA combined w/ Regional for post-op pain   Induction: Intravenous  PONV Risk Score and Plan: 2 and Ondansetron and Dexamethasone  Airway Management Planned: Oral ETT  Additional Equipment:   Intra-op Plan:   Post-operative Plan: Extubation in OR  Informed Consent: I have reviewed the patients History and Physical, chart, labs and discussed the procedure including the risks, benefits and alternatives for the proposed anesthesia with the patient or authorized representative who has indicated his/her understanding and acceptance.     Dental advisory given  Plan Discussed with: Anesthesiologist and CRNA  Anesthesia Plan Comments:       Anesthesia Quick Evaluation
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
## 2019-07-22 NOTE — Progress Notes (Signed)
Pt to SS. All belongings off. Cell phone and head phones placed in pt's book bag. Pt aware.

## 2019-07-22 NOTE — Transfer of Care (Signed)
Immediate Anesthesia Transfer of Care Note  Patient: Tristan White  Procedure(s) Performed: RIGHT KNEE ANTERIOR CRUCIATE LIGAMENT (ACL) RECONSTRUCTION, QUAD ALLOGRAFT vs QUAD AUTOGRAFT, MENISCAL DEBRIDEMENT vs REPAIR, POSTEROLATERAL CORNER RECONSTRUCTION vs REPAIR (Right Knee)  Patient Location: PACU  Anesthesia Type:GA combined with regional for post-op pain  Level of Consciousness: drowsy and patient cooperative  Airway & Oxygen Therapy: Patient Spontanous Breathing and Patient connected to nasal cannula oxygen  Post-op Assessment: Report given to RN and Post -op Vital signs reviewed and stable  Post vital signs: Reviewed and stable  Last Vitals:  Vitals Value Taken Time  BP 169/95 07/22/19 1809  Temp    Pulse    Resp 12 07/22/19 1810  SpO2    Vitals shown include unvalidated device data.  Last Pain:  Vitals:   07/22/19 1240  TempSrc:   PainSc: 0-No pain      Patients Stated Pain Goal: 3 (07/22/19 0915)  Complications: No apparent anesthesia complications and Patient re-intubated

## 2019-07-22 NOTE — Progress Notes (Signed)
Pt returned to 5N32. Pt rating pain 8/10 in right leg. Pain meds given. Pt refusing CPM at this time due to pain. Call bell within reach. Spouse in room.

## 2019-07-22 NOTE — Plan of Care (Signed)
  Problem: Health Behavior/Discharge Planning: Goal: Ability to manage health-related needs will improve Outcome: Progressing   Problem: Activity: Goal: Risk for activity intolerance will decrease Outcome: Progressing   Problem: Pain Managment: Goal: General experience of comfort will improve Outcome: Progressing   

## 2019-07-22 NOTE — Progress Notes (Addendum)
Patient resting comfortably in pre-op.  Discussed procedure with him and answered questions to his satisfaction.  Patient wishes to proceed with surgery with autograft rather than allograft.  No lovenox administered today. Ready for surgical procedure.  Plan at this time is for fixation of the tibial plateau fragment as well as lateral collateral and posterior lateral corner reconstruction versus repair and ACL reconstruction.  All questions answered.

## 2019-07-22 NOTE — Progress Notes (Signed)
PT Cancellation Note  Patient Details Name: Tristan White MRN: [id number] DOB: [birth year]   Cancelled Treatment:    Reason Eval/Treat Not Completed: (P) Patient at procedure or test/unavailable(Pt off unit for R knee surgery.  Will f/u per POC)   Fabricio Endsley Artis Delay 07/22/2019, 1:56 PM  Bonney Leitz , PTA Acute Rehabilitation Services Pager [phone] Office [phone]

## 2019-07-23 ENCOUNTER — Encounter: Payer: Self-pay | Admitting: *Deleted
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
## 2019-07-23 NOTE — Progress Notes (Signed)
Orthopedic Tech Progress Note Patient Details:  Tristan White 1985/04/29 [id number] Spoke with patient this morning about getting in the CPM he said he wasn't ready yet but I did set the CPM up for the degrees the MD/PA requested. Gave patient the number to reach me so I could come an apply when he was ready, notified RN. Patient ID: Tristan White, male   DOB: 14-Dec-1985, 34 y.o.   MRN: [id number]   Donald Pore 07/23/2019, 8:28 AM

## 2019-07-23 NOTE — Plan of Care (Signed)

## 2019-07-23 NOTE — Plan of Care (Signed)
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
## 2019-07-23 NOTE — Progress Notes (Addendum)
Inpatient Rehabilitation-Admissions Coordinator   Met with pt and his fiance bedside. Pt was working well with PT and OT but still in considerable pain. Noted he is still getting IV dilaudid approx every 4.5 hours. Discussed that we do not utilize IV pain meds in CIR and that we will need to see if he can tolerate therapy without them prior to accepting him to CIR. Discussed barriers to CIR-fiance continuing to work on getting pt a ground level apartment. Pt feels he can gather support to help him throughout the day in addition to the support his fiance can provide. Finally, we reviewed the estimated daily cost of care. Both pt and his fiance in agreement to pursue CIR under self pay at this time, but are still hopeful for Medicaid. AC will follow up with pt tomorrow for possible admit, pending pain tolerance and bed availability.   Raechel Ache, OTR/L  Rehab Admissions Coordinator  [phone] 07/23/2019 4:55 PM
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
## 2019-07-24 MED ORDER — APIXABAN 2.5 MG PO TABS
2.5000 mg | ORAL_TABLET | Freq: Two times a day (BID) | ORAL | Status: DC
  Administered 2019-07-24 – 2019-07-25 (×3): 2.5 mg via ORAL
  Filled 2019-07-24 (×3): qty 1

## 2019-07-24 MED ORDER — TRAMADOL HCL 50 MG PO TABS
100.0000 mg | ORAL_TABLET | Freq: Four times a day (QID) | ORAL | Status: DC
  Administered 2019-07-24 – 2019-07-25 (×5): 100 mg via ORAL
  Filled 2019-07-24 (×5): qty 2

## 2019-07-24 MED ORDER — LIDOCAINE 5 % EX PTCH
1.0000 | MEDICATED_PATCH | CUTANEOUS | Status: DC
  Administered 2019-07-24 – 2019-07-25 (×2): 1 via TRANSDERMAL
  Filled 2019-07-24 (×2): qty 1

## 2019-07-24 NOTE — Progress Notes (Signed)
Inpatient Rehabilitation-Admissions Coordinator   I do not have a bed available for this patient today. Hopeful for admit to CIR in the next day or two. Pt aware. Will follow up tomorrow.   Cheri Rous, OTR/L  Rehab Admissions Coordinator  [phone] 07/24/2019 12:16 PM
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
## 2019-07-24 NOTE — TOC Progression Note (Signed)
Transition of Care [facility]) - Progression Note    Patient Details  Name: MAKSYMILIAN MABEY MRN: [id number] Date of Birth: [birth year]  [facility] CM/SW Contact  Glennon Mac, RN Phone Number: 07/24/2019, 1:47 PM  Clinical Narrative:   Medically stable for discharge pending CIR bed; no bed today, per [facility].  Will follow.     Expected Discharge Plan: IP Rehab Facility Barriers to Discharge: Other (comment)(CIR pending bed availability)  Expected Discharge Plan and Services Expected Discharge Plan: IP Rehab Facility In-house Referral: Financial Counselor Discharge Planning Services: CM Consult   Living arrangements for the past 2 months: Apartment                                       Social Determinants of Health (SDOH) Interventions    Readmission Risk Interventions No flowsheet data found.  Quintella Baton, RN, BSN  Trauma/Neuro ICU Case Manager [phone]
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
## 2019-07-24 NOTE — Plan of Care (Signed)

## 2019-07-25 ENCOUNTER — Encounter (HOSPITAL_COMMUNITY): Payer: Self-pay

## 2019-07-25 ENCOUNTER — Inpatient Hospital Stay (HOSPITAL_COMMUNITY)
Admission: RE | Admit: 2019-07-25 | Discharge: 2019-08-01 | DRG: 999 | Disposition: A | Discharge status: Home Health | Payer: No Typology Code available for payment source | Source: Intra-hospital | Attending: Physical Medicine & Rehabilitation | Admitting: Physical Medicine & Rehabilitation

## 2019-07-25 ENCOUNTER — Encounter (HOSPITAL_COMMUNITY): Payer: Self-pay | Admitting: Physical Medicine & Rehabilitation

## 2019-07-25 DIAGNOSIS — T1490XA Injury, unspecified, initial encounter: Secondary | ICD-10-CM | POA: Diagnosis present

## 2019-07-25 DIAGNOSIS — R739 Hyperglycemia, unspecified: Secondary | ICD-10-CM | POA: Diagnosis present

## 2019-07-25 DIAGNOSIS — S72002D Fracture of unspecified part of neck of left femur, subsequent encounter for closed fracture with routine healing: Secondary | ICD-10-CM

## 2019-07-25 DIAGNOSIS — S83104D Unspecified dislocation of right knee, subsequent encounter: Secondary | ICD-10-CM

## 2019-07-25 DIAGNOSIS — Z833 Family history of diabetes mellitus: Secondary | ICD-10-CM

## 2019-07-25 DIAGNOSIS — F17290 Nicotine dependence, other tobacco product, uncomplicated: Secondary | ICD-10-CM | POA: Diagnosis present

## 2019-07-25 DIAGNOSIS — S72012D Unspecified intracapsular fracture of left femur, subsequent encounter for closed fracture with routine healing: Secondary | ICD-10-CM

## 2019-07-25 DIAGNOSIS — G4700 Insomnia, unspecified: Secondary | ICD-10-CM

## 2019-07-25 DIAGNOSIS — S82142D Displaced bicondylar fracture of left tibia, subsequent encounter for closed fracture with routine healing: Secondary | ICD-10-CM

## 2019-07-25 DIAGNOSIS — R945 Abnormal results of liver function studies: Secondary | ICD-10-CM | POA: Diagnosis present

## 2019-07-25 DIAGNOSIS — S52021E Displaced fracture of olecranon process without intraarticular extension of right ulna, subsequent encounter for open fracture type I or II with routine healing: Secondary | ICD-10-CM

## 2019-07-25 DIAGNOSIS — D62 Acute posthemorrhagic anemia: Secondary | ICD-10-CM

## 2019-07-25 DIAGNOSIS — J45909 Unspecified asthma, uncomplicated: Secondary | ICD-10-CM | POA: Diagnosis present

## 2019-07-25 DIAGNOSIS — R5381 Other malaise: Secondary | ICD-10-CM | POA: Diagnosis present

## 2019-07-25 DIAGNOSIS — S8412XA Injury of peroneal nerve at lower leg level, left leg, initial encounter: Secondary | ICD-10-CM

## 2019-07-25 DIAGNOSIS — E871 Hypo-osmolality and hyponatremia: Secondary | ICD-10-CM | POA: Diagnosis present

## 2019-07-25 DIAGNOSIS — R42 Dizziness and giddiness: Secondary | ICD-10-CM | POA: Diagnosis present

## 2019-07-25 DIAGNOSIS — D72829 Elevated white blood cell count, unspecified: Secondary | ICD-10-CM | POA: Diagnosis present

## 2019-07-25 DIAGNOSIS — S72302A Unspecified fracture of shaft of left femur, initial encounter for closed fracture: Secondary | ICD-10-CM | POA: Diagnosis present

## 2019-07-25 DIAGNOSIS — S8412XD Injury of peroneal nerve at lower leg level, left leg, subsequent encounter: Secondary | ICD-10-CM

## 2019-07-25 DIAGNOSIS — S82142A Displaced bicondylar fracture of left tibia, initial encounter for closed fracture: Secondary | ICD-10-CM | POA: Diagnosis present

## 2019-07-25 DIAGNOSIS — Z823 Family history of stroke: Secondary | ICD-10-CM

## 2019-07-25 DIAGNOSIS — S2243XA Multiple fractures of ribs, bilateral, initial encounter for closed fracture: Secondary | ICD-10-CM

## 2019-07-25 DIAGNOSIS — K5900 Constipation, unspecified: Secondary | ICD-10-CM | POA: Diagnosis present

## 2019-07-25 DIAGNOSIS — S83511D Sprain of anterior cruciate ligament of right knee, subsequent encounter: Secondary | ICD-10-CM

## 2019-07-25 DIAGNOSIS — S72302E Unspecified fracture of shaft of left femur, subsequent encounter for open fracture type I or II with routine healing: Principal | ICD-10-CM

## 2019-07-25 DIAGNOSIS — S82141D Displaced bicondylar fracture of right tibia, subsequent encounter for closed fracture with routine healing: Secondary | ICD-10-CM

## 2019-07-25 DIAGNOSIS — S72002A Fracture of unspecified part of neck of left femur, initial encounter for closed fracture: Secondary | ICD-10-CM | POA: Diagnosis present

## 2019-07-25 DIAGNOSIS — S022XXD Fracture of nasal bones, subsequent encounter for fracture with routine healing: Secondary | ICD-10-CM

## 2019-07-25 DIAGNOSIS — R7303 Prediabetes: Secondary | ICD-10-CM | POA: Diagnosis present

## 2019-07-25 MED ORDER — LIDOCAINE 5 % EX PTCH
3.0000 | MEDICATED_PATCH | CUTANEOUS | Status: DC
  Administered 2019-07-26 – 2019-08-01 (×7): 3 via TRANSDERMAL
  Filled 2019-07-25 (×7): qty 3

## 2019-07-25 MED ORDER — CYCLOBENZAPRINE HCL 5 MG PO TABS
5.0000 mg | ORAL_TABLET | Freq: Three times a day (TID) | ORAL | Status: DC
  Administered 2019-07-25 – 2019-08-01 (×21): 5 mg via ORAL
  Filled 2019-07-25 (×21): qty 1

## 2019-07-25 MED ORDER — GUAIFENESIN-DM 100-10 MG/5ML PO SYRP: 5.0000 mL | ORAL_SOLUTION | Freq: Four times a day (QID) | ORAL | Status: DC | PRN

## 2019-07-25 MED ORDER — PROCHLORPERAZINE EDISYLATE 10 MG/2ML IJ SOLN: 5.0000 mg | Freq: Four times a day (QID) | INTRAMUSCULAR | Status: DC | PRN

## 2019-07-25 MED ORDER — POLYETHYLENE GLYCOL 3350 17 G PO PACK: 17.0000 g | PACK | Freq: Every day | ORAL | Status: DC | PRN

## 2019-07-25 MED ORDER — POLYETHYLENE GLYCOL 3350 17 G PO PACK
17.0000 g | PACK | Freq: Every day | ORAL | Status: DC
  Filled 2019-07-25 (×6): qty 1

## 2019-07-25 MED ORDER — DOCUSATE SODIUM 100 MG PO CAPS
100.0000 mg | ORAL_CAPSULE | Freq: Two times a day (BID) | ORAL | Status: DC
  Administered 2019-07-25 – 2019-07-31 (×12): 100 mg via ORAL
  Filled 2019-07-25 (×15): qty 1

## 2019-07-25 MED ORDER — TRAMADOL HCL 50 MG PO TABS
100.0000 mg | ORAL_TABLET | Freq: Four times a day (QID) | ORAL | Status: DC
  Administered 2019-07-25 – 2019-08-01 (×25): 100 mg via ORAL
  Filled 2019-07-25 (×26): qty 2

## 2019-07-25 MED ORDER — OXYCODONE HCL 5 MG PO TABS
10.0000 mg | ORAL_TABLET | ORAL | Status: DC | PRN
  Administered 2019-07-25 – 2019-07-26 (×2): 15 mg via ORAL
  Administered 2019-07-26 (×2): 10 mg via ORAL
  Administered 2019-07-27 – 2019-07-30 (×9): 15 mg via ORAL
  Administered 2019-07-31: 10 mg via ORAL
  Administered 2019-07-31 – 2019-08-01 (×6): 15 mg via ORAL
  Filled 2019-07-25 (×8): qty 3
  Filled 2019-07-25: qty 2
  Filled 2019-07-25: qty 3
  Filled 2019-07-25: qty 2
  Filled 2019-07-25: qty 3
  Filled 2019-07-25 (×2): qty 2
  Filled 2019-07-25 (×7): qty 3

## 2019-07-25 MED ORDER — ACETAMINOPHEN 500 MG PO TABS
1000.0000 mg | ORAL_TABLET | Freq: Four times a day (QID) | ORAL | Status: DC
  Administered 2019-07-25 – 2019-08-01 (×24): 1000 mg via ORAL
  Filled 2019-07-25 (×25): qty 2

## 2019-07-25 MED ORDER — TRAZODONE HCL 50 MG PO TABS
25.0000 mg | ORAL_TABLET | Freq: Every evening | ORAL | Status: DC | PRN
  Administered 2019-07-25: 21:00:00 50 mg via ORAL
  Filled 2019-07-25: qty 1

## 2019-07-25 MED ORDER — CYCLOBENZAPRINE HCL 5 MG PO TABS
5.0000 mg | ORAL_TABLET | Freq: Three times a day (TID) | ORAL | Status: DC | PRN
  Administered 2019-07-31: 5 mg via ORAL
  Filled 2019-07-25 (×3): qty 1

## 2019-07-25 MED ORDER — MELATONIN 3 MG PO TABS
3.0000 mg | ORAL_TABLET | Freq: Every day | ORAL | Status: DC
  Administered 2019-07-25: 21:00:00 3 mg via ORAL
  Filled 2019-07-25: qty 1

## 2019-07-25 MED ORDER — ALUM & MAG HYDROXIDE-SIMETH 200-200-20 MG/5ML PO SUSP: 30.0000 mL | ORAL | Status: DC | PRN

## 2019-07-25 MED ORDER — BISACODYL 10 MG RE SUPP: 10.0000 mg | Freq: Every day | RECTAL | Status: DC | PRN

## 2019-07-25 MED ORDER — PROCHLORPERAZINE 25 MG RE SUPP: 12.5000 mg | Freq: Four times a day (QID) | RECTAL | Status: DC | PRN

## 2019-07-25 MED ORDER — APIXABAN 2.5 MG PO TABS
2.5000 mg | ORAL_TABLET | Freq: Two times a day (BID) | ORAL | Status: DC
  Administered 2019-07-25 – 2019-08-01 (×14): 2.5 mg via ORAL
  Filled 2019-07-25 (×14): qty 1

## 2019-07-25 MED ORDER — DIPHENHYDRAMINE HCL 12.5 MG/5ML PO ELIX: 12.5000 mg | ORAL_SOLUTION | Freq: Four times a day (QID) | ORAL | Status: DC | PRN

## 2019-07-25 MED ORDER — CHLORHEXIDINE GLUCONATE 0.12 % MT SOLN
15.0000 mL | Freq: Two times a day (BID) | OROMUCOSAL | Status: DC
  Administered 2019-07-25 – 2019-07-30 (×5): 15 mL via OROMUCOSAL
  Filled 2019-07-25 (×12): qty 15

## 2019-07-25 MED ORDER — HYDROCERIN EX CREA
TOPICAL_CREAM | Freq: Two times a day (BID) | CUTANEOUS | Status: DC
  Filled 2019-07-25: qty 113

## 2019-07-25 MED ORDER — BOOST / RESOURCE BREEZE PO LIQD CUSTOM
1.0000 | Freq: Three times a day (TID) | ORAL | Status: DC
  Administered 2019-07-25 – 2019-07-31 (×11): 1 via ORAL

## 2019-07-25 MED ORDER — FLEET ENEMA 7-19 GM/118ML RE ENEM: 1.0000 | ENEMA | Freq: Once | RECTAL | Status: DC | PRN

## 2019-07-25 MED ORDER — PROCHLORPERAZINE MALEATE 5 MG PO TABS: 5.0000 mg | ORAL_TABLET | Freq: Four times a day (QID) | ORAL | Status: DC | PRN
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
## 2019-07-25 NOTE — Progress Notes (Signed)
Inpatient Rehabilitation-Admissions Coordinator   I have received medical clearance from attending service for admit to CIR today. I have a bed available and pt has accepted bed offer. Reviewed insurance benefits letter (pt self pay) as well as consent form. RN and [facility] team aware of plan for admit today.   Please call if questions.   Cheri Rous, OTR/L  Rehab Admissions Coordinator  [phone] 07/25/2019 11:31 AM
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
## 2019-07-25 NOTE — Plan of Care (Signed)
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
## 2019-07-25 NOTE — TOC Transition Note (Signed)
Transition of Care [facility]) - CM/SW Discharge Note   Patient Details  Name: Tristan [name] MRN: [id number] Date of Birth: [birth year]  [facility] CM/SW Contact:  Glennon Mac, RN Phone Number: 07/25/2019, 3:37 PM   Clinical Narrative:  Pt medically stable for discharge and has been accepted for admission to [facility] IP Rehab today. Plan dc to CIR when bed ready.  Final next level of care: IP Rehab Facility Barriers to Discharge: Barriers Resolved   Patient Goals and CMS Choice Patient states their goals for this hospitalization and ongoing recovery are:: TO go back home      Discharge Placement                       Discharge Plan and Services In-house Referral: Financial Counselor Discharge Planning Services: CM Consult                                 Social Determinants of Health (SDOH) Interventions     Readmission Risk Interventions No flowsheet data found.  Quintella Baton, RN, BSN  Trauma/Neuro ICU Case Manager [phone]
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
## 2019-07-25 NOTE — Discharge Instructions (Signed)

## 2019-07-25 NOTE — Progress Notes (Signed)
Pt was up in a wheelchair this morning with PT. LLE with ace wrap dry and intact, long hinged brace on. Pain is controlled with oral pain meds.  1645 Discharged pt to IPR 4W08, report was given to Ed. Pt's fiance present upon discharge.
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
## 2019-07-25 NOTE — Progress Notes (Signed)
Patient admitted from [facility], appears alert with no current complaints of pain. Assigned to [id number] [facility].

## 2019-07-25 NOTE — Progress Notes (Signed)
Patient refuses bed alarm, writer explained policy, but patient still refuses. Stated that he can't get up and will not attempt to do so for fear of falling.

## 2019-07-26 ENCOUNTER — Inpatient Hospital Stay (HOSPITAL_COMMUNITY): Payer: Self-pay | Admitting: Physical Therapy

## 2019-07-26 ENCOUNTER — Inpatient Hospital Stay (HOSPITAL_COMMUNITY): Payer: Self-pay | Admitting: Occupational Therapy

## 2019-07-26 ENCOUNTER — Encounter (HOSPITAL_COMMUNITY): Payer: Self-pay | Admitting: Physical Medicine & Rehabilitation

## 2019-07-26 ENCOUNTER — Inpatient Hospital Stay (HOSPITAL_COMMUNITY): Payer: No Typology Code available for payment source

## 2019-07-26 DIAGNOSIS — S83104D Unspecified dislocation of right knee, subsequent encounter: Secondary | ICD-10-CM

## 2019-07-26 DIAGNOSIS — S72002S Fracture of unspecified part of neck of left femur, sequela: Secondary | ICD-10-CM

## 2019-07-26 DIAGNOSIS — S72002A Fracture of unspecified part of neck of left femur, initial encounter for closed fracture: Secondary | ICD-10-CM

## 2019-07-26 DIAGNOSIS — S82142S Displaced bicondylar fracture of left tibia, sequela: Secondary | ICD-10-CM

## 2019-07-26 DIAGNOSIS — S72302A Unspecified fracture of shaft of left femur, initial encounter for closed fracture: Secondary | ICD-10-CM

## 2019-07-26 DIAGNOSIS — S72365S Nondisplaced segmental fracture of shaft of left femur, sequela: Secondary | ICD-10-CM

## 2019-07-26 LAB — COMPREHENSIVE METABOLIC PANEL
ALT: 40 U/L (ref 0–44)
AST: 42 U/L — ABNORMAL HIGH (ref 15–41)
Albumin: 3.2 g/dL — ABNORMAL LOW (ref 3.5–5.0)
Alkaline Phosphatase: 164 U/L — ABNORMAL HIGH (ref 38–126)
Anion gap: 11 (ref 5–15)
BUN: 11 mg/dL (ref 6–20)
CO2: 27 mmol/L (ref 22–32)
Calcium: 9.2 mg/dL (ref 8.9–10.3)
Chloride: 96 mmol/L — ABNORMAL LOW (ref 98–111)
Creatinine, Ser: 0.66 mg/dL (ref 0.61–1.24)
GFR calc Af Amer: >60 mL/min (ref >60)
GFR calc non Af Amer: >60 mL/min (ref >60)
Glucose, Bld: 100 mg/dL — ABNORMAL HIGH (ref 70–99)
Potassium: 3.9 mmol/L (ref 3.5–5.1)
Sodium: 134 mmol/L — ABNORMAL LOW (ref 135–145)
Total Bilirubin: 0.7 mg/dL (ref 0.3–1.2)
Total Protein: 7.2 g/dL (ref 6.5–8.1)

## 2019-07-26 LAB — CBC WITH DIFFERENTIAL/PLATELET
Abs Immature Granulocytes: 0.09 10*3/uL — ABNORMAL HIGH (ref 0.00–0.07)
Basophils Absolute: 0.1 10*3/uL (ref 0.0–0.1)
Basophils Relative: 1 %
Eosinophils Absolute: 0.2 10*3/uL (ref 0.0–0.5)
Eosinophils Relative: 2 %
HCT: 31.2 % — ABNORMAL LOW (ref 39.0–52.0)
Hemoglobin: 10.1 g/dL — ABNORMAL LOW (ref 13.0–17.0)
Immature Granulocytes: 1 %
Lymphocytes Relative: 33 %
Lymphs Abs: 2.6 10*3/uL (ref 0.7–4.0)
MCH: 30.5 pg (ref 26.0–34.0)
MCHC: 32.4 g/dL (ref 30.0–36.0)
MCV: 94.3 fL (ref 80.0–100.0)
Monocytes Absolute: 0.9 10*3/uL (ref 0.1–1.0)
Monocytes Relative: 11 %
Neutro Abs: 4.2 10*3/uL (ref 1.7–7.7)
Neutrophils Relative %: 52 %
Platelets: 812 10*3/uL — ABNORMAL HIGH (ref 150–400)
RBC: 3.31 MIL/uL — ABNORMAL LOW (ref 4.22–5.81)
RDW: 14 % (ref 11.5–15.5)
WBC: 7.9 10*3/uL (ref 4.0–10.5)
nRBC: 0 % (ref 0.0–0.2)

## 2019-07-26 LAB — HEMOGLOBIN A1C
Hgb A1c MFr Bld: 5.3 % (ref 4.8–5.6)
Mean Plasma Glucose: 105.41 mg/dL

## 2019-07-26 MED ORDER — MELATONIN 3 MG PO TABS
9.0000 mg | ORAL_TABLET | Freq: Every day | ORAL | Status: DC
  Administered 2019-07-26: 9 mg via ORAL
  Filled 2019-07-26: qty 3

## 2019-07-26 MED ORDER — ZOLPIDEM TARTRATE 5 MG PO TABS
5.0000 mg | ORAL_TABLET | Freq: Every evening | ORAL | Status: DC | PRN
  Administered 2019-07-26: 5 mg via ORAL
  Filled 2019-07-26: qty 1

## 2019-07-26 NOTE — Care Management (Signed)
[facility]patient [facility] Center Individual Statement of Services  Patient Name:  JAIDAN PREVETTE  Date:  07/26/2019  Welcome to the Inpatient Rehabilitation Center.  Our goal is to provide you with an individualized program based on your diagnosis and situation, designed to meet your specific needs.  With this comprehensive rehabilitation program, you will be expected to participate in at least 3 hours of rehabilitation therapies Monday-Friday, with modified therapy programming on the weekends.  Your rehabilitation program will include the following services:  Physical Therapy (PT), Occupational Therapy (OT), 24 hour per day rehabilitation nursing, Therapeutic Recreation (TR), Neuropsychology, Case Management (Social Worker), Rehabilitation Medicine, Nutrition Services and Pharmacy Services  Weekly team conferences will be held on Tuesdays to discuss your progress.  Your Social Worker will talk with you frequently to get your input and to update you on team discussions.  Team conferences with you and your family in attendance may also be held.  Expected length of stay: 2 weeks  Overall anticipated outcome: CGA-Supervision overall  Depending on your progress and recovery, your program may change. Your Social Worker will coordinate services and will keep you informed of any changes. Your Social Worker's name and contact numbers are listed  below.  The following services may also be recommended but are not provided by the Inpatient Rehabilitation Center:    Home Health Rehabilitation Services  Outpatient Rehabilitation Services   Arrangements will be made to provide these services after discharge if needed.  Arrangements include referral to agencies that provide these services.  Your insurance has been verified to be:  Med Pay Assurance Your primary doctor is:  No PCP  Pertinent information will be shared with your doctor and your insurance company.  Social Worker:  Cecile Sheerer, MSW,  [place],  [phone] or [phone]   Information discussed with and copy given to patient by: Pamelia Hoit, 07/26/2019, 2:14 PM
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
## 2019-07-26 NOTE — Progress Notes (Signed)
Lower venous duplex       has been completed. Preliminary results can be found under CV proc through chart review. [place] Kath, BS, RDMS, RVT   
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
## 2019-07-26 NOTE — Progress Notes (Signed)
Patient information reviewed and entered into [facility] System by Becky Avraham Benish, PPS coordinator. Information including medical coding, function ability, and quality indicators will be reviewed and updated through discharge.   
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
## 2019-07-27 MED ORDER — MELATONIN 3 MG PO TABS
9.0000 mg | ORAL_TABLET | Freq: Every day | ORAL | Status: DC
  Administered 2019-07-27 – 2019-07-31 (×5): 9 mg via ORAL
  Filled 2019-07-27 (×5): qty 3

## 2019-07-27 MED ORDER — ZOLPIDEM TARTRATE 5 MG PO TABS
5.0000 mg | ORAL_TABLET | Freq: Every day | ORAL | Status: DC
  Administered 2019-07-27 – 2019-07-31 (×5): 5 mg via ORAL
  Filled 2019-07-27 (×5): qty 1

## 2019-07-27 NOTE — Progress Notes (Signed)
Pt "I did 75 on CPM machine about 1.5 hours."

## 2019-07-27 NOTE — Plan of Care (Signed)
  Problem: RH BOWEL ELIMINATION Goal: RH STG MANAGE BOWEL WITH ASSISTANCE Description: STG Manage Bowel with Assistance. Outcome: Not Progressing; refuse miralax claims he goes 3-4 days for bm Flowsheets (Taken 07/27/2019 1026) STG: Pt will manage bowels with assistance: 3-Moderate assistance
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
## 2019-07-28 ENCOUNTER — Inpatient Hospital Stay (HOSPITAL_COMMUNITY): Payer: Self-pay | Admitting: Occupational Therapy

## 2019-07-28 ENCOUNTER — Inpatient Hospital Stay (HOSPITAL_COMMUNITY): Payer: Self-pay

## 2019-07-28 NOTE — Progress Notes (Signed)
Physical Therapy Session Note  Patient Details  Name: Tristan White MRN: [id number] Date of Birth: 1985-08-29  Today's Date: 07/28/2019 PT Individual Time: 6606-3016 PT Individual Time Calculation (min): 59 min   Short Term Goals: Week 1:  PT Short Term Goal 1 (Week 1): STGs= LTGs due to LOS.  Skilled Therapeutic Interventions/Progress Updates:     Session 1: Patient in bed with L Bledsoe brace with DF attachment in place and locked in 0 degrees ROM upon PT arrival. Patient alert and agreeable to PT session. Patient denied pain at beginning of session, however, Patient reported 6/10 R knee pain with mobility during session, RN made aware. PT provided repositioning, rest breaks, and distraction as pain interventions throughout session.  Spoke with Dr. Riley Kill, MD, about orders for wearing LE braces. Confirmed that patient should wear B Bledsoe braces when performing mobility, L LE brace should be locking at 0 degrees knee extension, also stated that patient should not be propelling w/c with R UE due to NWB precautions. Educated patient on discussion with MD and discussed POC and goals for d/c. Patient reported that he can d/c to his fiance's sister's house, 1 level home with 1-2 STE and that he was told that he will have medical transport home, however, he would transport home via a 4 door sedan with a bench seat in the back and is willing to attempt this transfer based on patient's progress and pain management.   Patient donned R Bledsoe brace with mod A for placement and securing the brace. Patient has several questions about his injuries. Listed off injuries to patient and precautions. Discussed the difference between pain between LE injuries, as patient is having increased pain in his R knee compared to his L LE. Provided basic anatomy and recovery education throughout.   Therapeutic Activity: Bed Mobility: Patient attempted to perform supine to/from sit with min A for LE managment, however,  was unable to tolerate lowering R LE to the floor or having it propped up and returned to supin. Provided verbal cues for performing lying to long sit then walking LEs off the EOB with assist from L UE.  Therapeutic Exercise: Patient performed the following exercises with verbal and tactile cues for proper technique. -R knee flexion/extension AAROM 2x10 -B ankle DF stretch 2x30 with gentile pressure  Patient in bed at end of session with breaks locked, bed alarm set, and all needs within reach.   Session 2: Patient in bed with B Bledsoe braces and L DF attachment in place fiance in the room upon PT arrival. Patient alert and agreeable to PT session. Patient denied pain during session, reported that he had received pain medicine prior to session. Patient reported skin irritation from R brace, doffed R brace with min A for lifting R LE to remove brace. PT donned 2-4" ACE wraps to protect skin from brace, then donned R brace with min A for lifting R LE and placement.   Therapeutic Activity: Bed Mobility: Patient performed supine to/from sit with supervision in a flat bed without use of bed rails. Provided verbal cues for use of L UE to lift B LEs using braces to move LEs off the bed. Also provided cues for elbow weight bearing only on R and avoiding R elbow extension with mobility.  Transfers: Patient performed a slide board transfer bed<>w/c with min A-close supervision and total A for board placement. Provided cues for hand placement, board placement, maintaining weight bearing precautions, and head-hips relationship for proper technique  and decreased assist with transfers.   Patient and his wife had several question about his injuries and why he had limitations on ROM and weight bearing for each injury, patient asked to review imagining. Educated patient and his fiance on all injuries while showing post-op images. Provided education on general anatomy and hardware placed at each injury. Educated on  the healing process and risk of non-healing fractures if weight bearing precautions were not followed at this point in the healing process. Patient and his fiance very receptive and appreciative of education.   Determined several inconsistencies in the chart versus what the patient recalled from education during his stay on L LE ROM, R knee brace, w/c propulsion, where to weight bear on his R elbow. Will confirm with medical team to clarify all precautions with mobility.   Patient in bed at end of session with breaks locked, bed alarm set, and all needs within reach. Patient's fiance placed and set the CPM machine for his R LE without cues from therapist with correct set-up. Cleared her for setting up the CPM for him in the afternoons after therapy, RN made aware. Patient able to state 5 degree progression daily from previous education.    Therapy Documentation Precautions:  Precautions Precautions: Fall, Other (comment)(WB restrictions :  NWB BLEs, WB right UE only through elbow.) Required Braces or Orthoses: Other Brace(Bledsoe brace left LE.) Restrictions Weight Bearing Restrictions: Yes RUE Weight Bearing: Weight bear through elbow only LUE Weight Bearing: Weight bearing as tolerated RLE Weight Bearing: Non weight bearing LLE Weight Bearing: Non weight bearing Other Position/Activity Restrictions: Per ortho notes:  LLE -aggressive PROM at ankle and toe for LLE, hinged knee brace with ROM as tolerated.  R elbow:no active extension of R elbow, PROM extension Ok;PROM/AROM flexion okay;no pushing off chair/bed, etc with RUE;can push up through elbow.  R knee per secure chat Dr. Marlou Sa: AROM as tolerated, no hinged brace needed   Therapy/Group: Individual Therapy  Tristan White PT, DPT  07/28/2019, 1:27 PM

## 2019-07-28 NOTE — Progress Notes (Signed)
PAtient sitting on side of bed with braces on both legs. Fiancee in room. Educated patient that both lower extremity is NWB claims he is aware of it . Reminded patient about fall precaution claims he is ok. Bed in lowest position. Refuse to place bed alarm. Educated as well.

## 2019-07-28 NOTE — Progress Notes (Signed)
Occupational Therapy Session Note  Patient Details  Name: Tristan White MRN: [id number] Date of Birth: [birth year]  Today's Date: 07/28/2019 OT Individual Time: 1050-1201 OT Individual Time Calculation (min): 71 min   Short Term Goals: Week 1:  OT Short Term Goal 1 (Week 1): STG = LTGs due to ELOS  Skilled Therapeutic Interventions/Progress Updates:    Pt greeted in bed and premedicated for pain. Agreeable to try to problem solve toileting as he has not attempted [facility] transfer at CIR yet. Started with AP transfer method, pt able to complete with Min A. He reported that with LEs elevated on bed he could not comfortably void. Pt wanted to try transfer method with both LEs in a lower position. OT introduced the slideboard with pt completing transfer to drop arm BSC with Min A for board placement and Rt LE guidance. Pt required vcs throughout transfers to WB only through his Rt elbow vs hand. He was able to lower shorts and complete simulated perihygiene by leaning on his Rt elbow as needed. OT propped both feet up for increased comfort and NWB adherence. Pt completed this transfer again once OT lowered the height of BSC. Pt reported even more comfort with this modification. He was agreeable to allow OT to change safety plan so that he could engage in OOB toileting with staff. RN made aware of safety plan update. Pt then transferred back to bed using the slideboard with Min A once again. He was able to reposition himself for comfort with increased time. Ice provided for Rt knee for pain mgt. Left him with all needs within reach.   Therapy Documentation Precautions:  Precautions Precautions: Fall, Other (comment)(WB restrictions :  NWB BLEs, WB right UE only through elbow.) Required Braces or Orthoses: Other Brace(Bledsoe brace left LE.) Restrictions Weight Bearing Restrictions: Yes RUE Weight Bearing: Weight bear through elbow only LUE Weight Bearing: Weight bearing as tolerated RLE Weight Bearing:  Non weight bearing LLE Weight Bearing: Non weight bearing Other Position/Activity Restrictions: Per ortho notes:  LLE -aggressive PROM at ankle and toe for LLE, hinged knee brace with ROM as tolerated.  R elbow:no active extension of R elbow, PROM extension Ok;PROM/AROM flexion okay;no pushing off chair/bed, etc with RUE;can push up through elbow.  R knee per secure chat Dr. August Saucer: AROM as tolerated, no hinged brace needed ADL:       Therapy/Group: Individual Therapy  [name] 07/28/2019, 12:48 PM
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
## 2019-07-29 ENCOUNTER — Inpatient Hospital Stay (HOSPITAL_COMMUNITY): Payer: Self-pay | Admitting: Occupational Therapy

## 2019-07-29 ENCOUNTER — Inpatient Hospital Stay (HOSPITAL_COMMUNITY): Payer: Self-pay

## 2019-07-29 ENCOUNTER — Other Ambulatory Visit: Payer: Self-pay

## 2019-07-29 ENCOUNTER — Encounter (HOSPITAL_COMMUNITY): Payer: Self-pay | Admitting: Psychology

## 2019-07-29 ENCOUNTER — Telehealth: Payer: Self-pay

## 2019-07-29 DIAGNOSIS — T1490XA Injury, unspecified, initial encounter: Secondary | ICD-10-CM

## 2019-07-29 LAB — BASIC METABOLIC PANEL
Anion gap: 14 (ref 5–15)
BUN: 12 mg/dL (ref 6–20)
CO2: 26 mmol/L (ref 22–32)
Calcium: 9.3 mg/dL (ref 8.9–10.3)
Chloride: 95 mmol/L — ABNORMAL LOW (ref 98–111)
Creatinine, Ser: 0.64 mg/dL (ref 0.61–1.24)
GFR calc Af Amer: >60 mL/min (ref >60)
GFR calc non Af Amer: >60 mL/min (ref >60)
Glucose, Bld: 111 mg/dL — ABNORMAL HIGH (ref 70–99)
Potassium: 3.9 mmol/L (ref 3.5–5.1)
Sodium: 135 mmol/L (ref 135–145)

## 2019-07-29 LAB — CBC
HCT: 29.9 % — ABNORMAL LOW (ref 39.0–52.0)
Hemoglobin: 9.7 g/dL — ABNORMAL LOW (ref 13.0–17.0)
MCH: 30 pg (ref 26.0–34.0)
MCHC: 32.4 g/dL (ref 30.0–36.0)
MCV: 92.6 fL (ref 80.0–100.0)
Platelets: 800 10*3/uL — ABNORMAL HIGH (ref 150–400)
RBC: 3.23 MIL/uL — ABNORMAL LOW (ref 4.22–5.81)
RDW: 13.6 % (ref 11.5–15.5)
WBC: 6.1 10*3/uL (ref 4.0–10.5)
nRBC: 0 % (ref 0.0–0.2)
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
## 2019-07-29 NOTE — Telephone Encounter (Signed)
Rinaldo Cloud with [facility].would like a call back concerning CPM for patient and would like to know if it is 0-40 or 0-90.  Cb# [phone].  Please advise.  Thank you.

## 2019-07-29 NOTE — Telephone Encounter (Signed)
IC Rinaldo Cloud and advised. Verbalized understanding.

## 2019-07-29 NOTE — Progress Notes (Addendum)
  Subjective: Patient is a 34 year old male who is about 1 week out from right knee ACL reconstruction with quadriceps autograft and posterior lateral corner reconstruction with allograft and tibial plateau fracture ORIF.  He is admitted to rehab unit.  He notes that tentative discharge date is this coming Saturday.  He is doing okay, does note moderate pain in the right knee.  Notes continued swelling to the right knee as well.  He is remain nonweightbearing and using the CPM machine for the right leg.  He is up 90 degrees on CPM machine.  He has been doing physical therapy to focus on transfers.  Objective: Vital signs in last 24 hours: Temp:  [97.5 F (36.4 C)-99.5 F (37.5 C)] 97.5 F (36.4 C) (05/10 1339) Pulse Rate:  [85-107] 107 (05/10 1339) Resp:  [18] 18 (05/10 1339) BP: (116-126)/(77-86) 126/86 (05/10 1339) SpO2:  [99 %] 99 % (05/10 1339)  Intake/Output from previous day: 05/09 0701 - 05/10 0700 In: 860 [P.O.:860] Out: 2250 [Urine:2250] Intake/Output this shift: Total I/O In: 200 [P.O.:200] Out: -   Exam:  Right knee Exam Postop dressings in place Moderate effusion Stable to varus/valgus stresses.  Stable to anterior/posterior drawer.  Graft is stable by Lachman exam Extension to 0 degrees Flexion to 60 degrees with guarding Dorsiflexion of the ankle is intact No calf tenderness Compartments are soft and nontender  Labs: Recent Labs    07/29/19 0538  HGB 9.7*   Recent Labs    07/29/19 0538  WBC 6.1  RBC 3.23*  HCT 29.9*  PLT 800*   Recent Labs    07/29/19 0538  NA 135  K 3.9  CL 95*  CO2 26  BUN 12  CREATININE 0.64  GLUCOSE 111*  CALCIUM 9.3   No results for input(s): LABPT, INR in the last 72 hours.  Assessment/Plan: Patient is 1 week out from right knee multiligament reconstructive surgery. -Continue pain control -Grafts are stable on exam -Continue use of CPM machine from 0 to 90 degrees.  Continue nonweightbearing status.  Plan to begin  weightbearing on the right lower extremity in about 3 weeks (4 weeks out from surgical procedure) -Follow-up with Dr. August Saucer after discharge    Joycie Peek Nathasha Fiorillo 07/29/2019, 5:23 PM

## 2019-07-29 NOTE — Telephone Encounter (Signed)
Pls advise. Thanks.  

## 2019-07-29 NOTE — Anesthesia Postprocedure Evaluation (Signed)
Anesthesia Post Note  Patient: Thurmon Mizell Allemand  Procedure(s) Performed: RIGHT KNEE ANTERIOR CRUCIATE LIGAMENT (ACL) RECONSTRUCTION, QUAD ALLOGRAFT vs QUAD AUTOGRAFT, MENISCAL DEBRIDEMENT vs REPAIR, POSTEROLATERAL CORNER RECONSTRUCTION vs REPAIR (Right Knee)     Patient location during evaluation: PACU Anesthesia Type: General and Regional Level of consciousness: awake and alert Pain management: pain level controlled Vital Signs Assessment: post-procedure vital signs reviewed and stable Respiratory status: spontaneous breathing, nonlabored ventilation, respiratory function stable and patient connected to nasal cannula oxygen Cardiovascular status: blood pressure returned to baseline and stable Postop Assessment: no apparent nausea or vomiting Anesthetic complications: no    Last Vitals:  Vitals:   07/24/19 1959 07/25/19 0832  BP: 131/83 134/77  Pulse: 97 94  Resp: 17 18  Temp: 36.9 C 36.6 C  SpO2: 99% 93%    Last Pain:  Vitals:   07/25/19 1600  TempSrc:   PainSc: 7                  Kalen Neidert

## 2019-07-29 NOTE — Progress Notes (Signed)
Occupational Therapy Session Note  Patient Details  Name: Tristan White MRN: [id number] Date of Birth: 1985-10-19  Today's Date: 07/29/2019 OT Individual Time: 1100-1200 OT Individual Time Calculation (min): 60 min   Short Term Goals: Week 1:  OT Short Term Goal 1 (Week 1): STG = LTGs due to ELOS  Skilled Therapeutic Interventions/Progress Updates:    Pt greeted seated in recliner and agreeable to OT treatment session. Pt completed slideboard transfer from drop arm recliner to drop arm wc with min A and verbal cues for maintaining NWB through R wrist and only weight bearing through elbow. Pt still would weight bear through R wrist at times during transfer. Will try to get sling ordered to help decrease urge to use R UE during transfers. OT assist to don elevating leg rests. Pt then propelled wc to therapy gym with verbal cues for short R arm movements and to limit elbow extension. Slideboard transfer to therapy mat with CGA to the L side. Worked on weight shifting with card sorting actvivity with cards placed under hips to simulate clothing management and toileting. Pt given home measurement sheet as well for fiance to fill out. OT issued pt LH reacher and practiced threading shorts using reacher and theraband. Pt transferred back to wc, then to recliner in similar fashion as above. Pt left seated in recliner with LEs elevated and needs met.   Therapy Documentation Precautions:  Precautions Precautions: Fall, Other (comment)(WB restrictions :  NWB BLEs, WB right UE only through elbow.) Required Braces or Orthoses: Other Brace(Bledsoe brace left LE.) Restrictions Weight Bearing Restrictions: Yes RUE Weight Bearing: Weight bear through elbow only LUE Weight Bearing: Weight bearing as tolerated RLE Weight Bearing: Non weight bearing LLE Weight Bearing: Non weight bearing Other Position/Activity Restrictions: Per ortho notes:  LLE -aggressive PROM at ankle and toe for LLE, hinged knee brace with  ROM as tolerated.  R elbow:no active extension of R elbow, PROM extension Ok;PROM/AROM flexion okay;no pushing off chair/bed, etc with RUE;can push up through elbow.  R knee per secure chat Dr. Marlou Sa: AROM as tolerated, no hinged brace needed Pain: Pain Assessment Pain Scale: 0-10 Pain Score: 8  Pain Type: Surgical pain Pain Location: Generalized Pain Orientation: Right;Left Pain Radiating Towards: arm, leg, hip,and back Pain Descriptors / Indicators: Aching;Discomfort;Dull;Sore;Throbbing Pain Frequency: Constant Pain Onset: On-going Pain Intervention(s): Repositioned   Therapy/Group: Individual Therapy  Valma Cava 07/29/2019, 12:57 PM

## 2019-07-29 NOTE — Progress Notes (Signed)
Called ortho to place cpm to right leg
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
## 2019-07-29 NOTE — Telephone Encounter (Signed)
0-90 should be good at this point.  He may have to work on progressing from 0-40 ROM to 0-90 ROM

## 2019-07-29 NOTE — Progress Notes (Signed)
Physical Therapy Session Note  Patient Details  Name: Tristan White MRN: [id number] Date of Birth: [birth year]  Today's Date: 07/29/2019 PT Individual Time: 0900-1004 and 1350-1505 PT Individual Time Calculation (min): 64 min and 75 min   Short Term Goals: Week 1:  PT Short Term Goal 1 (Week 1): STGs= LTGs due to LOS.  Skilled Therapeutic Interventions/Progress Updates:     Session 1: Patient in bed upon PT arrival. Patient alert and agreeable to PT session. Patient denied pain during session, reported muscle soreness/tightness with exercises that resolved at rest. Patient continues to have difficulty maintaining weight bearing through elbow only on R and avoiding active extension of R UE. Discussed with patient and medical team about benefit of a sling to assist with following these precautions. Also, clarified with Pam, PA, that patient does not need to wear the R Bledsoe brace and CPM set between 10-40 degrees.  Focused session on ROM and functional transfers.   Therapeutic Activity: Bed Mobility: Patient performed supine to long sit x3 with supervision. Provided verbal cues for maintainging elbow weight bearing and no active elbow extension with his R UE. Transfers: Patient performed an a/P transfer bed>recliner with CGA-close supervision. Provided cues for L hand and R elbow placement and head-hips relationship for proper technique and decreased assist with transfers.   Therapeutic Exercise: Patient performed the following exercises with verbal and tactile cues for proper technique. -B quad sets x10 with 5 sec hold -B ankle pumps with AAROM on L x20 -B hip add/abd AAROM x10 -B glut sets x10 with 5 sec hold -B DF stretch 2x30 with gentle pressure  -L SLR AAROM x10  Patient in recliner with B LEs elevated at end of session with breaks locked and all needs within reach.   Session 2: Patient in recliner upon PT arrival. Patient alert and agreeable to PT session. Patient denied pain  at beginning of session, reported mild R knee pain during session with mobility, patient pre-medicated prior to session. PT provided repositioning, rest breaks, and distraction as pain interventions throughout session. Patient with b Bledsoe braced donned at beginning of session. Educated patient about d/c orders for R brace, patient agreeable to trial mobility without brace this session. Demonstrated use of gait belt as leg lifter for more independent mobility without the brace. Patient utilized this technique throughout session.   Therapeutic Activity: Bed Mobility: Patient performed sit to supine with supervision using gait bed/leg lifter in a flat bed without rails. Provided verbal cues for placement of leg lifter. Transfers: Patient performed a slide board transfer recliner>w/c to the L and w/c>bed to the Rwith CGA-close supervision and total A for board placement x3, patient able to remove board x1 with supervision. Provided cues for hand placement, board placement, and head-hips relationship for proper technique and decreased assist with transfers.  Patient performed a simulated sedan height car transfer to bench seat performed slide board transfers w/c<>car seat then backing in to the car and scooting forward to get out x1 and A/P transfer with LE placed onto the car seat and scooting forward into the car and backwards into the w/c from the car with min A for LE management, total A for board placement, and supervision for scooting, utilized gait belt/leg lifter throughout. Provided demonstration and cues for safe technique, provided cues for elbow weight bearing of R UE x1.  Wheelchair Mobility:  Patient propelled wheelchair 50 feet with supervision performing short strokes to maintain 90 degrees flexion of R elbow throughout and  light touch for propulsion. Provided verbal cues for technique throughout as patient has difficulty not extending his R UE. Patient frustrated at his slow speed due to  technique, educated about only performing household mobility at d/c and increased independence with this technique. Requires total A for management of ELRs and cuing for use of breaks throughout session.   Patient in bed with B LEs elevated at end of session with breaks locked and all needs within reach. Patient requested to not have bed alarm set due to sensitivity of alarm with mobility, reports that he has been allowed to do this by nursing staff. Patient demonstrates good safety awareness with mobility and left with bed alarm off at end of session.   Therapy Documentation Precautions:  Precautions Precautions: Fall, Other (comment)(WB restrictions :  NWB BLEs, WB right UE only through elbow.) Required Braces or Orthoses: Other Brace(Bledsoe brace left LE.) Restrictions Weight Bearing Restrictions: Yes RUE Weight Bearing: Weight bear through elbow only LUE Weight Bearing: Weight bearing as tolerated RLE Weight Bearing: Non weight bearing LLE Weight Bearing: Non weight bearing Other Position/Activity Restrictions: Per ortho notes:  LLE -aggressive PROM at ankle and toe for LLE, hinged knee brace with ROM as tolerated.  R elbow:no active extension of R elbow, PROM extension Ok;PROM/AROM flexion okay;no pushing off chair/bed, etc with RUE;can push up through elbow.  R knee per secure chat Dr. Marlou Sa: AROM as tolerated, no hinged brace needed    Therapy/Group: Individual Therapy  Laverna Dossett L Hridaan Bouse PT, DPT  07/29/2019, 4:19 PM

## 2019-07-30 ENCOUNTER — Inpatient Hospital Stay (HOSPITAL_COMMUNITY): Payer: Self-pay

## 2019-07-30 ENCOUNTER — Inpatient Hospital Stay (HOSPITAL_COMMUNITY): Payer: Self-pay | Admitting: Occupational Therapy

## 2019-07-30 NOTE — Progress Notes (Signed)
Physical Therapy Session Note  Patient Details  Name: Tristan White MRN: [id number] Date of Birth: Jul 04, 1985  Today's Date: 07/30/2019 PT Individual Time: 0915-1000 and 1300-1423 PT Individual Time Calculation (min): 45 min and 83 min   Short Term Goals: Week 1:  PT Short Term Goal 1 (Week 1): STGs= LTGs due to LOS.  Skilled Therapeutic Interventions/Progress Updates:     Session 1: Patient in bed upon PT arrival. Patient alert and agreeable to PT session.Patient reported 7/10 R knee pain during session, reported he was pre-medicated prior to session. PT provided repositioning, rest breaks, and distraction as pain interventions throughout session. Focused session on LE exercises and ROM and d/c planning due to increased pain this session. Patient spoke to his fiance during session, plan for family education from 3-4 tomorrow, will discuss with rehab team about moving d/c up due to patient's progress and family edu being scheduled. Discussed mobility to be focused on during family edu, including unlevel transfers to/from bed, car transfers, and bumping the w/c up/down 2 steps with 2-3 person assist. Also will review OT education during session for bathing/dressing and self-care due to time constraints, Lanora Manis, OT in agreement. PT applied a cardboard extension covered by a towel for padding to patient's leg rest to provide increased support for his R foot and ankle.   Therapeutic Exercise: Patient performed the following exercises with verbal and tactile cues for proper technique. -B quad sets 2x10 with 5 sec hold -R heel slides 2x10 -B ankle pumps with AAROM on L 2x10 (no active DF, but active inversion) -B hip add/abd 2x10 -B glut sets 2x10 with 5 sec hold -B DF stretch with gait belt/leg lifter 2x30 with gentle pressure   Patient in bed at end of session with breaks locked and all needs within reach.   Session 2: Patient in bed with L Bledsoe brace and DF attachment donned and locked  at 0 degrees extension upon PT arrival. Patient alert and agreeable to PT session. Patient reported 8/10 L LE and foot pain with N/T on dorsal aspect of his foot during session, RN made aware and provided medication during session. PT provided repositioning, rest breaks, and distraction as pain interventions throughout session. Educated patient about nerve pain/sensation return, peroneal nerve dermatomal distribution, and signs and symptoms associated with this. Patient reported increased pressure under his heel and stated that his foot slides out of the DF attachment, RN made aware. Educated on risk of skin breakdown and importance of proper placement and readjusting the Bledsoe brace and DF attachment throughout the day.  Therapeutic Activity: Bed Mobility: Patient performed supine to/from sit with mod I using a gait belt as a leg lifter on R and increased time to maintain precautions, without cues, and for pain managment.  Transfers: Patient performed unlevel slide board transfers elevated bed<>w/c and w/c<>ADL couch with supervision for downhill transfers and CGA-min A for uphill transfers and total A for board placement. Provided cues for w/c set-up, hand/elbow placement, board placement, and head-hips relationship for proper technique and decreased assist with transfers. Offerred to practice A/P transfer to [facility], due to insurance not covering a drop arm BSC, patient reported that he is going to order a drop arm BSC and declined practicing A/P transfer.  Wheelchair Mobility:  Patient propelled wheelchair ~50 feet x2 and in the room and ADL apartment to simulate household mobility with supervision-mod I performing short strokes to maintain 90 degrees flexion of R elbow throughout and light touch for propulsion with  R UE. Provided verbal cues for management in tight spaces and maintaining 90 degrees of elbow flexion x1. Patient required cues for use of breaks using L hand only and total A for management of  leg rests for patient to lift LEs with L UE support using gait belt or brace.   Demonstrated bumping up/down steps in an empty w/c in the patient's room for him to observe the technique to use to bump up 2 steps at d/c. Determined that patient will need 3 people to assist due to B ELRs limiting front access to the w/c.   Patient then reported that he would be doing this up 2 flights of stairs to his apartment stating, "I have to go home first." Patient did report that he planned on staying at his fiance's sister's house after going home for a while, but that he would be going home first. Discussed concerns about patient being bumped up so many steps for safety of the patient and those assisting, PT informed him that this is not recommended and will be an unsafe risk at d/c. Patient asked about medical transport home, PT advised him of the potential cost of the service and safety risk of not being able to leave his apartment. Patient adamant that he will be going home. Informed Auria, CSW, at end of session of patients change in d/c plan and she will discuss medical transport with him. Plan to review concerns with patient and his fiance during family education session tomorrow.   Patient in bed at end of session with breaks locked, bed alarm set, and all needs within reach.   Patient missed 7 min of skilled PT due to patient fatigue, RN made aware. Will attempt to make-up missed time as able.     Therapy Documentation Precautions:  Precautions Precautions: Fall Required Braces or Orthoses: Other Brace(Bledsoe brace left LE.) Other Brace: B hinged knee braces Restrictions Weight Bearing Restrictions: Yes RUE Weight Bearing: Weight bear through elbow only LUE Weight Bearing: Weight bearing as tolerated RLE Weight Bearing: Non weight bearing LLE Weight Bearing: Non weight bearing Other Position/Activity Restrictions: no active elbow extension   Therapy/Group: Individual Therapy  Anida Deol L  Tristan White PT, DPT  07/30/2019, 4:53 PM

## 2019-07-30 NOTE — Progress Notes (Signed)
Occupational Therapy Session Note  Patient Details  Name: Tristan White MRN: [id number] Date of Birth: [birth year]  Today's Date: 07/30/2019 OT Individual Time: 6859-9234 OT Individual Time Calculation (min): 57 min    Short Term Goals: Week 1:  OT Short Term Goal 1 (Week 1): STG = LTGs due to ELOS  Skilled Therapeutic Interventions/Progress Updates:    Pt greeted semi-reclined in bed and agreeable to OT treatment session focused on self-care retraining. Bathing/dressing completed from bed level. OT provided education for rolling and technique for LB bathing/dressing using reacher. Pt needed overall set-up A, but needed OT assist to don socks 2/2 hinged knee braces and positioning. Rolling completed with supervision to wash buttocks and pull up pants. Pt able to come to long sitting for UB bathing/dressing with set-up A and verbal cues not to extend R elbow. OT discussed dc plan and modifications for BADLs at home. Pt verbalized understanding of need to bathe at bed level while NWB BLE. Pt left semi-reclined in bed with needs met.   Therapy Documentation Precautions:  Precautions Precautions: Fall, Other (comment)(WB restrictions :  NWB BLEs, WB right UE only through elbow.) Required Braces or Orthoses: Other Brace(Bledsoe brace left LE.) Restrictions Weight Bearing Restrictions: Yes RUE Weight Bearing: Weight bear through elbow only LUE Weight Bearing: Weight bearing as tolerated RLE Weight Bearing: Non weight bearing LLE Weight Bearing: Non weight bearing Other Position/Activity Restrictions: Per ortho notes:  LLE -aggressive PROM at ankle and toe for LLE, hinged knee brace with ROM as tolerated.  R elbow:no active extension of R elbow, PROM extension Ok;PROM/AROM flexion okay;no pushing off chair/bed, etc with RUE;can push up through elbow.  R knee per secure chat Dr. Marlou Sa: AROM as tolerated, no hinged brace needed Pain: Pain Assessment Pain Scale: 0-10 Pain Score: 9  Pain Type:  Surgical pain Pain Location: Generalized Pain Orientation: Right;Left Pain Radiating Towards: arm, leg, hip Pain Descriptors / Indicators: Aching;Discomfort;Sore;Throbbing Pain Frequency: Constant Pain Onset: On-going Pain Intervention(s): Medication (See eMAR)   Therapy/Group: Individual Therapy  Valma Cava 07/30/2019, 8:29 AM
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
## 2019-07-30 NOTE — Progress Notes (Signed)
Occupational Therapy Discharge Summary  Patient Details  Name: Tristan White MRN: [id number] Date of Birth: [birth year]  Patient has met 6 of 6 long term goals due to improved activity tolerance, improved balance, postural control and ability to compensate for deficits.  Patient to discharge at overall supervision/ Min Assist level.  Patient's care partner is independent to provide the necessary physical assistance at discharge.    Reasons goals not met: n/a  Recommendation:  Patient will benefit from ongoing skilled OT services in home health setting to continue to advance functional skills in the area of BADL.  Equipment: slideboard, drop arm BSC (or standard BSC if drop arm is not covered and pt does not want to pay out of pocket)  Reasons for discharge: treatment goals met and discharge from hospital  Patient/family agrees with progress made and goals achieved: Yes  OT Discharge Precautions/Restrictions  Precautions Precautions: Fall Other Brace: B hinged knee braces Restrictions Weight Bearing Restrictions: Yes RUE Weight Bearing: Weight bear through elbow only LUE Weight Bearing: Weight bearing as tolerated RLE Weight Bearing: Non weight bearing LLE Weight Bearing: Non weight bearing Other Position/Activity Restrictions: no active elbow extension ADL ADL Eating: Independent Grooming: Independent Upper Body Bathing: Supervision/safety, Setup Lower Body Bathing: Supervision/safety, Setup Upper Body Dressing: Supervision/safety Lower Body Dressing: Minimal assistance Toileting: Supervision/safety, Setup Toilet Transfer: Close supervision Cognition Overall Cognitive Status: Within Functional Limits for tasks assessed Arousal/Alertness: Awake/alert Orientation Level: Oriented X4 Sensation Sensation Light Touch: Appears Intact Proprioception: Appears Intact Coordination Fine Motor Movements are Fluid and Coordinated: Yes Balance Dynamic Sitting Balance Dynamic  Sitting - Balance Support: During functional activity Dynamic Sitting - Level of Assistance: 7: Independent Extremity/Trunk Assessment RUE Assessment RUE Assessment: Exceptions to [facility] General Strength Comments: 5/5 shoulder, wrist, grip - did not assess elbow due to order for no elbow extension LUE Assessment LUE Assessment: Within Functional Limits   Daneen Schick Tuleen Mandelbaum 07/30/2019, 3:50 PM
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
## 2019-07-30 NOTE — Progress Notes (Signed)
Pt in therapy c/o of pressure sensation that is new to left foot. Dressing in place. Will notify P. Love, PA-c

## 2019-07-30 NOTE — Progress Notes (Signed)
Pts L knee sutures removed by Thomes Lolling , RN, dry dressing applied w/ace bandage. Pt tolerated the procedure well. No signs of distress noted. Ortho tech called to help assemble the brace back on the pt.
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
## 2019-07-31 ENCOUNTER — Ambulatory Visit (HOSPITAL_COMMUNITY): Payer: Self-pay

## 2019-07-31 ENCOUNTER — Inpatient Hospital Stay (HOSPITAL_COMMUNITY): Payer: Self-pay | Admitting: Occupational Therapy

## 2019-07-31 ENCOUNTER — Inpatient Hospital Stay (HOSPITAL_COMMUNITY): Payer: Self-pay

## 2019-07-31 MED ORDER — TRAMADOL HCL 50 MG PO TABS: 100.0000 mg | ORAL_TABLET | Freq: Four times a day (QID) | ORAL | 0 refills | Status: DC

## 2019-07-31 MED ORDER — DOCUSATE SODIUM 100 MG PO CAPS: 100.0000 mg | ORAL_CAPSULE | Freq: Two times a day (BID) | ORAL | 0 refills | Status: AC

## 2019-07-31 MED ORDER — MELATONIN 3 MG PO TABS: 9.0000 mg | ORAL_TABLET | Freq: Every day | ORAL | 0 refills | Status: AC

## 2019-07-31 MED ORDER — LIDOCAINE 5 % EX PTCH: MEDICATED_PATCH | CUTANEOUS | 0 refills | Status: AC

## 2019-07-31 MED ORDER — OXYCODONE HCL 10 MG PO TABS: 10.0000 mg | ORAL_TABLET | Freq: Four times a day (QID) | ORAL | 0 refills | Status: DC | PRN

## 2019-07-31 MED ORDER — POLYETHYLENE GLYCOL 3350 17 G PO PACK: 17.0000 g | PACK | Freq: Every day | ORAL | 0 refills | Status: AC

## 2019-07-31 MED ORDER — HYDROCERIN EX CREA: 1.0000 "application " | TOPICAL_CREAM | Freq: Two times a day (BID) | CUTANEOUS | 0 refills | Status: AC

## 2019-07-31 MED ORDER — APIXABAN 2.5 MG PO TABS: 2.5000 mg | ORAL_TABLET | Freq: Two times a day (BID) | ORAL | 0 refills | Status: DC

## 2019-07-31 MED ORDER — ZOLPIDEM TARTRATE 5 MG PO TABS: 5.0000 mg | ORAL_TABLET | Freq: Every day | ORAL | 0 refills | Status: DC

## 2019-07-31 MED ORDER — CYCLOBENZAPRINE HCL 5 MG PO TABS: 5.0000 mg | ORAL_TABLET | Freq: Three times a day (TID) | ORAL | 0 refills | Status: DC

## 2019-07-31 NOTE — Plan of Care (Signed)
  Problem: Consults Goal: RH BRAIN INJURY PATIENT EDUCATION Description: Description: See Patient Education module for eduction specifics Outcome: Progressing Goal: Skin Care Protocol Initiated - if Braden Score 18 or less Description: If consults are not indicated, leave blank or document N/A Outcome: Progressing Goal: Nutrition Consult-if indicated Outcome: Progressing   Problem: RH BOWEL ELIMINATION Goal: RH STG MANAGE BOWEL WITH ASSISTANCE Description: STG Manage Bowel with mod I Assistance. Outcome: Progressing Goal: RH STG MANAGE BOWEL W/MEDICATION W/ASSISTANCE Description: STG Manage Bowel with Medication with Mod I Assistance. Outcome: Progressing   Problem: RH SKIN INTEGRITY Goal: RH STG SKIN FREE OF INFECTION/BREAKDOWN Description: Skin to remain free from infection and breakdown with min assist while on rehab. Outcome: Progressing Goal: RH STG MAINTAIN SKIN INTEGRITY WITH ASSISTANCE Description: STG Maintain Skin Integrity With min Assistance. Outcome: Progressing Goal: RH STG ABLE TO PERFORM INCISION/WOUND CARE W/ASSISTANCE Description: STG Able To Perform Incision/Wound Care With min Assistance. Outcome: Progressing   Problem: RH SAFETY Goal: RH STG ADHERE TO SAFETY PRECAUTIONS W/ASSISTANCE/DEVICE Description: STG Adhere to Safety Precautions With min Assistance and appropriate assistive Device. Outcome: Progressing Goal: RH STG DECREASED RISK OF FALL WITH ASSISTANCE Description: STG Decreased Risk of Fall With min Assistance. Outcome: Progressing   Problem: RH PAIN MANAGEMENT Goal: RH STG PAIN MANAGED AT OR BELOW PT'S PAIN GOAL Description: <4 on a 0-10 pain scale. Outcome: Progressing   Problem: RH KNOWLEDGE DEFICIT BRAIN INJURY Goal: RH STG INCREASE KNOWLEDGE OF SELF CARE AFTER BRAIN INJURY Description: Patient and caregiver will demonstrate knowledge of weightbearing restrictions, dressing change management, medication management and follow up care with  the MD post discharge with mod I assist from rehab staff. Outcome: Progressing
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
## 2019-07-31 NOTE — Progress Notes (Signed)
Social Work Patient ID: Tristan White, male   DOB: July 03, 1985, 34 y.o.   MRN: [id number]   Pt already scheduled for hospital follow up/new pt appointments:  6/14 @ 1:00pm- hospital follow-up with Kathe Becton, NP at [facility]  6/16 @ 1:50pm- new patient appt with Antony Blackbird, MD at [facility]  Pt set up for [facility].   *PA Pam Love cancelled appt for 6/16 since pt has an appt on 6/14 already. SW met with pt in room to provide [facility] letter and review new patient appointments. SW confirmed DME in room. Pt called s/o Shaniqwa while in room to review d/c recommendations such as HH, DME, and new pt appointments. No questions/concerns reported.   Loralee Pacas, MSW, [place] Office: [phone] Cell: [phone] Fax: [phone]
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
## 2019-07-31 NOTE — Progress Notes (Signed)
Pt states the LBM is 5/9.21. denies feeling constipated.
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
## 2019-08-01 NOTE — Plan of Care (Signed)
  Problem: Consults Goal: RH BRAIN INJURY PATIENT EDUCATION Description: Description: See Patient Education module for eduction specifics Outcome: Progressing Goal: Skin Care Protocol Initiated - if Braden Score 18 or less Description: If consults are not indicated, leave blank or document N/A Outcome: Progressing Goal: Nutrition Consult-if indicated Outcome: Progressing   Problem: RH BOWEL ELIMINATION Goal: RH STG MANAGE BOWEL WITH ASSISTANCE Description: STG Manage Bowel with mod I Assistance. Outcome: Progressing Goal: RH STG MANAGE BOWEL W/MEDICATION W/ASSISTANCE Description: STG Manage Bowel with Medication with Mod I Assistance. Outcome: Progressing   Problem: RH SKIN INTEGRITY Goal: RH STG SKIN FREE OF INFECTION/BREAKDOWN Description: Skin to remain free from infection and breakdown with min assist while on rehab. Outcome: Progressing Goal: RH STG MAINTAIN SKIN INTEGRITY WITH ASSISTANCE Description: STG Maintain Skin Integrity With min Assistance. Outcome: Progressing Goal: RH STG ABLE TO PERFORM INCISION/WOUND CARE W/ASSISTANCE Description: STG Able To Perform Incision/Wound Care With min Assistance. Outcome: Progressing   Problem: RH SAFETY Goal: RH STG ADHERE TO SAFETY PRECAUTIONS W/ASSISTANCE/DEVICE Description: STG Adhere to Safety Precautions With min Assistance and appropriate assistive Device. Outcome: Progressing Goal: RH STG DECREASED RISK OF FALL WITH ASSISTANCE Description: STG Decreased Risk of Fall With min Assistance. Outcome: Progressing   Problem: RH PAIN MANAGEMENT Goal: RH STG PAIN MANAGED AT OR BELOW PT'S PAIN GOAL Description: <4 on a 0-10 pain scale. Outcome: Progressing   Problem: RH KNOWLEDGE DEFICIT BRAIN INJURY Goal: RH STG INCREASE KNOWLEDGE OF SELF CARE AFTER BRAIN INJURY Description: Patient and caregiver will demonstrate knowledge of weightbearing restrictions, dressing change management, medication management and follow up care with  the MD post discharge with mod I assist from rehab staff. Outcome: Progressing   

## 2019-08-01 NOTE — Discharge Instructions (Signed)
Inpatient Rehab Discharge Instructions  [facility] Discharge date and time:  08/01/19   Activities/Precautions/ Functional Status: Activity: no lifting, driving, or strenuous exercise  till cleared by MD.  Diet: diabetic diet Wound Care: Wash with soap and water. Keep clean and dry--no soaps, ointments or lotions.   Functional status:  ___ No restrictions     ___ Walk up steps independently _X__ 24/7 supervision/assistance   ___ Walk up steps with assistance ___ Intermittent supervision/assistance  ___ Bathe/dress independently ___ Walk with walker     _X__ Bathe/dress with assistance ___ Walk Independently    ___ Shower independently ___ Walk with assistance    ___ Shower with assistance _X__ No alcohol  ___ Return to work/school ________   COMMUNITY REFERRALS UPON DISCHARGE:    Home Health:   PT     OT                 Agency: [facility] Phone: [phone]   Medical Equipment/Items Ordered:wheelchair, slide board, and 3in1 bedside commode                                                 Agency/Supplier: [facility] [id number]  [facility] PATIENT/FAMILY: 6/14 @ 1:00pm- hospital follow-up with Kathe Becton, NP at [facility] Prescription referral; good for 7 days (5/13-5/20)   Special Instructions: 1.  No weight on right leg, left leg and right arm.   2.  Wear brace on left leg at all times. Do not fully extend right arm.    My questions have been answered and I understand these instructions. I will adhere to these goals and the provided educational materials after my discharge from the hospital.  Patient/Caregiver Signature _______________________________ Date __________  Clinician Signature _______________________________________ Date __________  Please bring this form and your medication list with you to all your follow-up doctor's appointments.    ============================================================================================= Information on my medicine - ELIQUIS (apixaban)  Why was Eliquis prescribed for you? Eliquis was prescribed for you to reduce the risk of blood clots forming after orthopedic surgery.    What do You need to know about Eliquis? Take your Eliquis TWICE DAILY - one tablet in the morning and one tablet in the evening with or without food.  It would be best to take the dose about the same time each day.  If you have difficulty swallowing the tablet whole please discuss with your pharmacist how to take the medication safely.  Take Eliquis exactly as prescribed by your doctor and DO NOT stop taking Eliquis without talking to the doctor who prescribed the medication.  Stopping without other medication to take the place of Eliquis may increase your risk of developing a clot.  After discharge, you should have regular check-up appointments with your healthcare provider that is prescribing your Eliquis.  What do you do if you miss a dose? If a dose of ELIQUIS is not taken at the scheduled time, take it as soon as possible on the same day and twice-daily administration should be resumed.  The dose should not be doubled to make up for a missed dose.  Do not take more than one tablet of ELIQUIS at the same time.  Important Safety Information A possible side effect of Eliquis is bleeding. You should call your healthcare provider right away if  you experience any of the following: ? Bleeding from an injury or your nose that does not stop. ? Unusual colored urine (red or dark brown) or unusual colored stools (red or black). ? Unusual bruising for unknown reasons. ? A serious fall or if you hit your head (even if there is no bleeding).  Some medicines may interact with Eliquis and might increase your risk of bleeding or clotting while on Eliquis. To help avoid this, consult your healthcare provider or  pharmacist prior to using any new prescription or non-prescription medications, including herbals, vitamins, non-steroidal anti-inflammatory drugs (NSAIDs) and supplements.  This website has more information on Eliquis (apixaban): http://www.eliquis.com/eliquis/home =============================================================================

## 2019-08-01 NOTE — Plan of Care (Signed)
  Problem: RH Balance Goal: LTG: Patient will maintain dynamic sitting balance (OT) Description: LTG:  Patient will maintain dynamic sitting balance with assistance during activities of daily living (OT) Outcome: Completed/Met   Problem: RH Bathing Goal: LTG Patient will bathe all body parts with assist levels (OT) Description: LTG: Patient will bathe all body parts with assist levels (OT) Outcome: Completed/Met   Problem: RH Dressing Goal: LTG Patient will perform upper body dressing (OT) Description: LTG Patient will perform upper body dressing with assist, with/without cues (OT). Outcome: Completed/Met Goal: LTG Patient will perform lower body dressing w/assist (OT) Description: LTG: Patient will perform lower body dressing with assist, with/without cues in positioning using equipment (OT) Outcome: Completed/Met   Problem: RH Toileting Goal: LTG Patient will perform toileting task (3/3 steps) with assistance level (OT) Description: LTG: Patient will perform toileting task (3/3 steps) with assistance level (OT)  Outcome: Completed/Met   Problem: RH Toilet Transfers Goal: LTG Patient will perform toilet transfers w/assist (OT) Description: LTG: Patient will perform toilet transfers with assist, with/without cues using equipment (OT) Outcome: Completed/Met

## 2019-08-02 DIAGNOSIS — D62 Acute posthemorrhagic anemia: Secondary | ICD-10-CM

## 2019-08-02 DIAGNOSIS — G4700 Insomnia, unspecified: Secondary | ICD-10-CM

## 2019-08-02 DIAGNOSIS — S8412XA Injury of peroneal nerve at lower leg level, left leg, initial encounter: Secondary | ICD-10-CM

## 2019-08-02 NOTE — Progress Notes (Signed)
Inpatient Rehab Care Coordinator Discharge Note Inpatient Rehabilitation Care Coordinator  Discharge Note  The overall goal for the admission was met for:   Discharge location: Yes. D/c to home with his fiance.   Length of Stay: Yes. 7 days  Discharge activity level: Yes. Minimal Assistance.   Home/community participation: Yes. Limited.  Services provided included: MD, RD, PT, OT, RN, CM, TR, Pharmacy, Neuropsych and SW  Financial Services: Other: Uninsured  Follow-up services arranged: Home Health: [facility] for PT/OT (charity) and DME: [facility] for w/c, slide board, 3in1 bedside commode  Comments (or additional information):contact pt [phone]  Patient/Family verbalized understanding of follow-up arrangements: Yes  Individual responsible for coordination of the follow-up plan: Pt to have assistance with coordinating his care needs.   Confirmed correct DME delivered: Rana Snare 08/02/2019    Rana Snare

## 2019-08-14 ENCOUNTER — Encounter: Payer: Self-pay | Attending: Physical Medicine and Rehabilitation | Admitting: Physical Medicine and Rehabilitation

## 2019-08-14 ENCOUNTER — Encounter: Payer: Self-pay | Admitting: Physical Medicine and Rehabilitation

## 2019-08-14 ENCOUNTER — Other Ambulatory Visit: Payer: Self-pay

## 2019-08-14 ENCOUNTER — Telehealth: Payer: Self-pay | Admitting: Radiology

## 2019-08-14 VITALS — BP 112/73 | HR 103 | Temp 97.7°F | Ht 71.0 in | Wt 152.0 lb

## 2019-08-14 DIAGNOSIS — Z87891 Personal history of nicotine dependence: Secondary | ICD-10-CM | POA: Insufficient documentation

## 2019-08-14 DIAGNOSIS — S72002S Fracture of unspecified part of neck of left femur, sequela: Secondary | ICD-10-CM

## 2019-08-14 DIAGNOSIS — R739 Hyperglycemia, unspecified: Secondary | ICD-10-CM | POA: Insufficient documentation

## 2019-08-14 DIAGNOSIS — R42 Dizziness and giddiness: Secondary | ICD-10-CM | POA: Insufficient documentation

## 2019-08-14 DIAGNOSIS — S82142S Displaced bicondylar fracture of left tibia, sequela: Secondary | ICD-10-CM

## 2019-08-14 DIAGNOSIS — R945 Abnormal results of liver function studies: Secondary | ICD-10-CM | POA: Insufficient documentation

## 2019-08-14 DIAGNOSIS — R202 Paresthesia of skin: Secondary | ICD-10-CM | POA: Insufficient documentation

## 2019-08-14 DIAGNOSIS — S2249XS Multiple fractures of ribs, unspecified side, sequela: Secondary | ICD-10-CM | POA: Insufficient documentation

## 2019-08-14 DIAGNOSIS — J45909 Unspecified asthma, uncomplicated: Secondary | ICD-10-CM | POA: Insufficient documentation

## 2019-08-14 DIAGNOSIS — F329 Major depressive disorder, single episode, unspecified: Secondary | ICD-10-CM | POA: Insufficient documentation

## 2019-08-14 DIAGNOSIS — R7303 Prediabetes: Secondary | ICD-10-CM | POA: Insufficient documentation

## 2019-08-14 DIAGNOSIS — D62 Acute posthemorrhagic anemia: Secondary | ICD-10-CM

## 2019-08-14 DIAGNOSIS — D72829 Elevated white blood cell count, unspecified: Secondary | ICD-10-CM | POA: Insufficient documentation

## 2019-08-14 DIAGNOSIS — S8412XS Injury of peroneal nerve at lower leg level, left leg, sequela: Secondary | ICD-10-CM

## 2019-08-14 DIAGNOSIS — E871 Hypo-osmolality and hyponatremia: Secondary | ICD-10-CM

## 2019-08-14 DIAGNOSIS — F1290 Cannabis use, unspecified, uncomplicated: Secondary | ICD-10-CM | POA: Insufficient documentation

## 2019-08-14 DIAGNOSIS — R200 Anesthesia of skin: Secondary | ICD-10-CM | POA: Insufficient documentation

## 2019-08-14 DIAGNOSIS — S022XXS Fracture of nasal bones, sequela: Secondary | ICD-10-CM | POA: Insufficient documentation

## 2019-08-14 DIAGNOSIS — G4700 Insomnia, unspecified: Secondary | ICD-10-CM | POA: Insufficient documentation

## 2019-08-14 MED ORDER — AMITRIPTYLINE HCL 10 MG PO TABS: 10.0000 mg | ORAL_TABLET | Freq: Every day | ORAL | 3 refills | Status: DC

## 2019-08-14 NOTE — Telephone Encounter (Signed)
Patient unable to come in morning due to transportation.  Schedule for first post op Wed. 6/2 at 2:15
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
## 2019-08-21 ENCOUNTER — Ambulatory Visit (INDEPENDENT_AMBULATORY_CARE_PROVIDER_SITE_OTHER): Payer: Self-pay | Admitting: Orthopedic Surgery

## 2019-08-21 DIAGNOSIS — Z9889 Other specified postprocedural states: Secondary | ICD-10-CM

## 2019-08-23 ENCOUNTER — Telehealth: Payer: Self-pay | Admitting: Orthopedic Surgery

## 2019-08-23 NOTE — Telephone Encounter (Signed)
Pt called asking for a note stating that he is not weight barring; pt needs this note to present to court and would like to have the note emailed to him.   [email or web address] [phone]

## 2019-08-23 NOTE — Telephone Encounter (Signed)
Pls advise. Thanks.  

## 2019-08-24 ENCOUNTER — Encounter: Payer: Self-pay | Admitting: Orthopedic Surgery
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
## 2019-08-26 NOTE — Telephone Encounter (Signed)
I s/w patient. Advised completed

## 2019-08-29 ENCOUNTER — Ambulatory Visit (INDEPENDENT_AMBULATORY_CARE_PROVIDER_SITE_OTHER): Payer: Self-pay | Admitting: Physical Therapy

## 2019-08-29 ENCOUNTER — Other Ambulatory Visit: Payer: Self-pay

## 2019-08-29 ENCOUNTER — Encounter: Payer: Self-pay | Admitting: Physical Therapy

## 2019-08-29 DIAGNOSIS — R2681 Unsteadiness on feet: Secondary | ICD-10-CM

## 2019-08-29 DIAGNOSIS — R2689 Other abnormalities of gait and mobility: Secondary | ICD-10-CM

## 2019-08-29 DIAGNOSIS — R531 Weakness: Secondary | ICD-10-CM

## 2019-08-29 DIAGNOSIS — R293 Abnormal posture: Secondary | ICD-10-CM

## 2019-08-29 DIAGNOSIS — M25662 Stiffness of left knee, not elsewhere classified: Secondary | ICD-10-CM

## 2019-08-29 DIAGNOSIS — M25561 Pain in right knee: Secondary | ICD-10-CM

## 2019-08-29 DIAGNOSIS — M25661 Stiffness of right knee, not elsewhere classified: Secondary | ICD-10-CM

## 2019-08-29 DIAGNOSIS — M25672 Stiffness of left ankle, not elsewhere classified: Secondary | ICD-10-CM

## 2019-08-29 DIAGNOSIS — M25562 Pain in left knee: Secondary | ICD-10-CM

## 2019-08-29 DIAGNOSIS — M21372 Foot drop, left foot: Secondary | ICD-10-CM

## 2019-08-30 ENCOUNTER — Ambulatory Visit (INDEPENDENT_AMBULATORY_CARE_PROVIDER_SITE_OTHER): Payer: Self-pay | Admitting: Rehabilitative and Restorative Service Providers"

## 2019-08-30 ENCOUNTER — Encounter: Payer: Self-pay | Admitting: Rehabilitative and Restorative Service Providers"

## 2019-08-30 DIAGNOSIS — M25561 Pain in right knee: Secondary | ICD-10-CM

## 2019-08-30 DIAGNOSIS — M25562 Pain in left knee: Secondary | ICD-10-CM

## 2019-08-30 DIAGNOSIS — M21372 Foot drop, left foot: Secondary | ICD-10-CM

## 2019-08-30 DIAGNOSIS — R2681 Unsteadiness on feet: Secondary | ICD-10-CM

## 2019-08-30 DIAGNOSIS — M25661 Stiffness of right knee, not elsewhere classified: Secondary | ICD-10-CM

## 2019-08-30 DIAGNOSIS — M25662 Stiffness of left knee, not elsewhere classified: Secondary | ICD-10-CM

## 2019-08-30 DIAGNOSIS — R531 Weakness: Secondary | ICD-10-CM

## 2019-08-30 DIAGNOSIS — R293 Abnormal posture: Secondary | ICD-10-CM

## 2019-08-30 DIAGNOSIS — R2689 Other abnormalities of gait and mobility: Secondary | ICD-10-CM

## 2019-08-30 DIAGNOSIS — M25672 Stiffness of left ankle, not elsewhere classified: Secondary | ICD-10-CM

## 2019-08-30 NOTE — Patient Instructions (Signed)
Access Code: [email or web address] URL: [email or web address] Date: 08/30/2019 Prepared by: Pauletta Browns  Exercises Supine Quadricep Sets - 3-10 x daily - 7 x weekly - 10-50 sets - 5 reps - 5 hold Small Range Straight Leg Raise - 1 x daily - 7 x weekly - 6-10 sets - 5 reps - 3 secondsinutes hold Tailgate knee flexion for 3 minutes multiple times per day
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
## 2019-09-02 ENCOUNTER — Inpatient Hospital Stay: Payer: Self-pay | Admitting: Family Medicine

## 2019-09-03 ENCOUNTER — Ambulatory Visit (INDEPENDENT_AMBULATORY_CARE_PROVIDER_SITE_OTHER): Payer: Self-pay | Admitting: Physical Therapy

## 2019-09-03 ENCOUNTER — Other Ambulatory Visit: Payer: Self-pay

## 2019-09-03 ENCOUNTER — Encounter: Payer: Self-pay | Admitting: Physical Therapy

## 2019-09-03 DIAGNOSIS — M25662 Stiffness of left knee, not elsewhere classified: Secondary | ICD-10-CM

## 2019-09-03 DIAGNOSIS — R2681 Unsteadiness on feet: Secondary | ICD-10-CM

## 2019-09-03 DIAGNOSIS — M25661 Stiffness of right knee, not elsewhere classified: Secondary | ICD-10-CM

## 2019-09-03 DIAGNOSIS — R293 Abnormal posture: Secondary | ICD-10-CM

## 2019-09-03 DIAGNOSIS — M21372 Foot drop, left foot: Secondary | ICD-10-CM

## 2019-09-03 DIAGNOSIS — Z9181 History of falling: Secondary | ICD-10-CM

## 2019-09-03 DIAGNOSIS — R2689 Other abnormalities of gait and mobility: Secondary | ICD-10-CM

## 2019-09-03 DIAGNOSIS — M25672 Stiffness of left ankle, not elsewhere classified: Secondary | ICD-10-CM

## 2019-09-03 DIAGNOSIS — R531 Weakness: Secondary | ICD-10-CM

## 2019-09-03 NOTE — Patient Instructions (Addendum)
Access Code: [email or web address] URL: [email or web address] Date: 09/03/2019 Prepared by: [facility]  Exercises Supine Quadricep Sets - 3-10 x daily - 7 x weekly - 10-50 sets - 5 reps - 5 hold Small Range Straight Leg Raise - 1 x daily - 7 x weekly - 6-10 sets - 5 reps - 3 secondsinutes hold Supine Straight Leg Raises - 1 x daily - 7 x weekly - 1 sets - 10 reps - 2-3 seconds hold Sidelying Hip Abduction - 1 x daily - 7 x weekly - 1 sets - 10 reps - 2-3 seconds hold Sidelying Hip Adduction - 1 x daily - 7 x weekly - 1 sets - 10 reps - 2-3 seconds hold Prone Hip Extension - 1 x daily - 7 x weekly - 1 sets - 10 reps - 2-3 seconds hold

## 2019-09-04 ENCOUNTER — Ambulatory Visit: Payer: Self-pay | Admitting: Family Medicine
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
## 2019-09-05 ENCOUNTER — Encounter: Payer: Self-pay | Admitting: Rehabilitative and Restorative Service Providers"

## 2019-09-10 ENCOUNTER — Ambulatory Visit (INDEPENDENT_AMBULATORY_CARE_PROVIDER_SITE_OTHER): Payer: Self-pay | Admitting: Rehabilitative and Restorative Service Providers"

## 2019-09-10 ENCOUNTER — Other Ambulatory Visit: Payer: Self-pay

## 2019-09-10 ENCOUNTER — Encounter: Payer: Self-pay | Admitting: Rehabilitative and Restorative Service Providers"

## 2019-09-10 DIAGNOSIS — R2681 Unsteadiness on feet: Secondary | ICD-10-CM

## 2019-09-10 DIAGNOSIS — R2689 Other abnormalities of gait and mobility: Secondary | ICD-10-CM

## 2019-09-10 DIAGNOSIS — R531 Weakness: Secondary | ICD-10-CM

## 2019-09-10 DIAGNOSIS — M25662 Stiffness of left knee, not elsewhere classified: Secondary | ICD-10-CM

## 2019-09-10 DIAGNOSIS — M25561 Pain in right knee: Secondary | ICD-10-CM

## 2019-09-10 DIAGNOSIS — M25562 Pain in left knee: Secondary | ICD-10-CM

## 2019-09-10 DIAGNOSIS — M25661 Stiffness of right knee, not elsewhere classified: Secondary | ICD-10-CM

## 2019-09-10 NOTE — Patient Instructions (Signed)
Access Code: [email or web address] URL: [email or web address] Date: 09/10/2019 Prepared by: Pauletta Browns  Exercises Supine Single Knee to Chest Stretch - 2-3 x daily - 7 x weekly - 1 sets - 5 reps - 20 seconds hold
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
## 2019-09-12 ENCOUNTER — Other Ambulatory Visit: Payer: Self-pay

## 2019-09-12 ENCOUNTER — Encounter: Payer: Self-pay | Admitting: Rehabilitative and Restorative Service Providers"

## 2019-09-12 ENCOUNTER — Ambulatory Visit (INDEPENDENT_AMBULATORY_CARE_PROVIDER_SITE_OTHER): Payer: Self-pay | Admitting: Rehabilitative and Restorative Service Providers"

## 2019-09-12 DIAGNOSIS — M25672 Stiffness of left ankle, not elsewhere classified: Secondary | ICD-10-CM

## 2019-09-12 DIAGNOSIS — M25661 Stiffness of right knee, not elsewhere classified: Secondary | ICD-10-CM

## 2019-09-12 DIAGNOSIS — M25662 Stiffness of left knee, not elsewhere classified: Secondary | ICD-10-CM

## 2019-09-12 DIAGNOSIS — M25561 Pain in right knee: Secondary | ICD-10-CM

## 2019-09-12 DIAGNOSIS — M25562 Pain in left knee: Secondary | ICD-10-CM

## 2019-09-12 DIAGNOSIS — R2681 Unsteadiness on feet: Secondary | ICD-10-CM

## 2019-09-12 DIAGNOSIS — R531 Weakness: Secondary | ICD-10-CM

## 2019-09-12 DIAGNOSIS — R2689 Other abnormalities of gait and mobility: Secondary | ICD-10-CM

## 2019-09-12 DIAGNOSIS — M21372 Foot drop, left foot: Secondary | ICD-10-CM
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
## 2019-09-12 NOTE — Patient Instructions (Signed)
Access Code: [email or web address] URL: [email or web address] Date: 09/12/2019 Prepared by: Pauletta Browns  Exercises Supine Gluteus Stretch - 2-3 x daily - 7 x weekly - 1 sets - 5 reps - 20 seconds hold Seated Hamstring Set - 1 x daily - 7 x weekly - 3-5 sets - 10 reps - 5 seconds hold

## 2019-09-16 ENCOUNTER — Ambulatory Visit (INDEPENDENT_AMBULATORY_CARE_PROVIDER_SITE_OTHER): Payer: Self-pay | Admitting: Rehabilitative and Restorative Service Providers"

## 2019-09-16 ENCOUNTER — Other Ambulatory Visit: Payer: Self-pay

## 2019-09-16 ENCOUNTER — Encounter: Payer: Self-pay | Admitting: Rehabilitative and Restorative Service Providers"

## 2019-09-16 DIAGNOSIS — M25661 Stiffness of right knee, not elsewhere classified: Secondary | ICD-10-CM

## 2019-09-16 DIAGNOSIS — R531 Weakness: Secondary | ICD-10-CM

## 2019-09-16 DIAGNOSIS — R2689 Other abnormalities of gait and mobility: Secondary | ICD-10-CM

## 2019-09-16 DIAGNOSIS — M25662 Stiffness of left knee, not elsewhere classified: Secondary | ICD-10-CM

## 2019-09-16 DIAGNOSIS — R2681 Unsteadiness on feet: Secondary | ICD-10-CM
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
## 2019-09-18 ENCOUNTER — Encounter: Payer: Self-pay | Admitting: Rehabilitative and Restorative Service Providers"

## 2019-09-18 ENCOUNTER — Other Ambulatory Visit: Payer: Self-pay

## 2019-09-18 ENCOUNTER — Ambulatory Visit (INDEPENDENT_AMBULATORY_CARE_PROVIDER_SITE_OTHER): Payer: Self-pay | Admitting: Rehabilitative and Restorative Service Providers"

## 2019-09-18 ENCOUNTER — Ambulatory Visit (INDEPENDENT_AMBULATORY_CARE_PROVIDER_SITE_OTHER): Payer: Self-pay | Admitting: Orthopedic Surgery

## 2019-09-18 DIAGNOSIS — R2681 Unsteadiness on feet: Secondary | ICD-10-CM

## 2019-09-18 DIAGNOSIS — M25662 Stiffness of left knee, not elsewhere classified: Secondary | ICD-10-CM

## 2019-09-18 DIAGNOSIS — R531 Weakness: Secondary | ICD-10-CM

## 2019-09-18 DIAGNOSIS — R2689 Other abnormalities of gait and mobility: Secondary | ICD-10-CM

## 2019-09-18 DIAGNOSIS — Z9889 Other specified postprocedural states: Secondary | ICD-10-CM

## 2019-09-18 DIAGNOSIS — M25561 Pain in right knee: Secondary | ICD-10-CM

## 2019-09-18 DIAGNOSIS — M25562 Pain in left knee: Secondary | ICD-10-CM

## 2019-09-18 DIAGNOSIS — M21372 Foot drop, left foot: Secondary | ICD-10-CM

## 2019-09-18 DIAGNOSIS — M25661 Stiffness of right knee, not elsewhere classified: Secondary | ICD-10-CM
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
## 2019-09-18 NOTE — Patient Instructions (Signed)
Access Code: [email or web address] URL: [email or web address] Date: 09/18/2019 Prepared by: Pauletta Browns  Exercises Prone Quadriceps Stretch with Strap - 2-3 x daily - 7 x weekly - 1 sets - 5 reps - 20 seconds hold

## 2019-09-23 ENCOUNTER — Encounter: Payer: Self-pay | Admitting: Orthopedic Surgery
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
## 2019-09-27 ENCOUNTER — Encounter: Payer: Self-pay | Admitting: Rehabilitative and Restorative Service Providers"

## 2019-10-01 ENCOUNTER — Encounter: Payer: Self-pay | Admitting: Rehabilitative and Restorative Service Providers"

## 2019-10-01 ENCOUNTER — Ambulatory Visit (INDEPENDENT_AMBULATORY_CARE_PROVIDER_SITE_OTHER): Payer: Self-pay | Admitting: Rehabilitative and Restorative Service Providers"

## 2019-10-01 ENCOUNTER — Other Ambulatory Visit: Payer: Self-pay

## 2019-10-01 DIAGNOSIS — M25661 Stiffness of right knee, not elsewhere classified: Secondary | ICD-10-CM

## 2019-10-01 DIAGNOSIS — R531 Weakness: Secondary | ICD-10-CM

## 2019-10-01 DIAGNOSIS — M25672 Stiffness of left ankle, not elsewhere classified: Secondary | ICD-10-CM

## 2019-10-01 DIAGNOSIS — R2681 Unsteadiness on feet: Secondary | ICD-10-CM

## 2019-10-01 DIAGNOSIS — M25562 Pain in left knee: Secondary | ICD-10-CM

## 2019-10-01 DIAGNOSIS — M25662 Stiffness of left knee, not elsewhere classified: Secondary | ICD-10-CM

## 2019-10-01 DIAGNOSIS — M21372 Foot drop, left foot: Secondary | ICD-10-CM

## 2019-10-01 DIAGNOSIS — R2689 Other abnormalities of gait and mobility: Secondary | ICD-10-CM

## 2019-10-01 DIAGNOSIS — M25561 Pain in right knee: Secondary | ICD-10-CM
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
## 2019-10-02 ENCOUNTER — Telehealth: Payer: Self-pay | Admitting: Family Medicine

## 2019-10-02 ENCOUNTER — Ambulatory Visit (INDEPENDENT_AMBULATORY_CARE_PROVIDER_SITE_OTHER): Payer: Self-pay | Admitting: Family Medicine

## 2019-10-02 ENCOUNTER — Encounter: Payer: Self-pay | Admitting: Family Medicine

## 2019-10-02 VITALS — BP 112/75 | HR 75 | Temp 98.1°F | Resp 16 | Ht 71.0 in | Wt 141.6 lb

## 2019-10-02 DIAGNOSIS — S72002S Fracture of unspecified part of neck of left femur, sequela: Secondary | ICD-10-CM

## 2019-10-02 DIAGNOSIS — Z7689 Persons encountering health services in other specified circumstances: Secondary | ICD-10-CM

## 2019-10-02 DIAGNOSIS — T1490XA Injury, unspecified, initial encounter: Secondary | ICD-10-CM

## 2019-10-02 DIAGNOSIS — S83104A Unspecified dislocation of right knee, initial encounter: Secondary | ICD-10-CM

## 2019-10-02 DIAGNOSIS — S83104D Unspecified dislocation of right knee, subsequent encounter: Secondary | ICD-10-CM

## 2019-10-02 DIAGNOSIS — D62 Acute posthemorrhagic anemia: Secondary | ICD-10-CM

## 2019-10-02 DIAGNOSIS — S72365S Nondisplaced segmental fracture of shaft of left femur, sequela: Secondary | ICD-10-CM

## 2019-10-02 DIAGNOSIS — S72002A Fracture of unspecified part of neck of left femur, initial encounter for closed fracture: Secondary | ICD-10-CM

## 2019-10-02 DIAGNOSIS — Z09 Encounter for follow-up examination after completed treatment for conditions other than malignant neoplasm: Secondary | ICD-10-CM

## 2019-10-02 DIAGNOSIS — G4700 Insomnia, unspecified: Secondary | ICD-10-CM

## 2019-10-02 DIAGNOSIS — S72365A Nondisplaced segmental fracture of shaft of left femur, initial encounter for closed fracture: Secondary | ICD-10-CM

## 2019-10-02 LAB — POCT URINALYSIS DIP (CLINITEK)
Bilirubin, UA: NEGATIVE
Blood, UA: NEGATIVE
Glucose, UA: NEGATIVE mg/dL
Ketones, POC UA: NEGATIVE mg/dL
Nitrite, UA: NEGATIVE
POC PROTEIN,UA: NEGATIVE
Spec Grav, UA: >=1.03 — AB (ref 1.010–1.025)
Urobilinogen, UA: 0.2 E.U./dL
pH, UA: 6.5 (ref 5.0–8.0)

## 2019-10-02 MED ORDER — ZOLPIDEM TARTRATE 5 MG PO TABS: 5.0000 mg | ORAL_TABLET | Freq: Every day | ORAL | 0 refills | Status: DC

## 2019-10-02 MED ORDER — APIXABAN 2.5 MG PO TABS: 2.5000 mg | ORAL_TABLET | Freq: Two times a day (BID) | ORAL | 3 refills | Status: AC

## 2019-10-02 MED ORDER — OXYCODONE-ACETAMINOPHEN 10-325 MG PO TABS: 1.0000 | ORAL_TABLET | Freq: Three times a day (TID) | ORAL | 0 refills | Status: DC | PRN

## 2019-10-02 MED ORDER — CYCLOBENZAPRINE HCL 5 MG PO TABS: 5.0000 mg | ORAL_TABLET | Freq: Three times a day (TID) | ORAL | 3 refills | Status: AC

## 2019-10-02 MED ORDER — AMITRIPTYLINE HCL 10 MG PO TABS: 10.0000 mg | ORAL_TABLET | Freq: Every day | ORAL | 3 refills | Status: AC

## 2019-10-02 MED ORDER — ZOLPIDEM TARTRATE 5 MG PO TABS: 5.0000 mg | ORAL_TABLET | Freq: Every day | ORAL | 0 refills | Status: AC

## 2019-10-02 MED ORDER — OXYCODONE-ACETAMINOPHEN 10-325 MG PO TABS: 1.0000 | ORAL_TABLET | Freq: Three times a day (TID) | ORAL | 0 refills | Status: AC | PRN

## 2019-10-02 MED FILL — ELIQUIS 2.5 MG TABLET: 2.5 | 30 days supply | Qty: 60 | Fill #0

## 2019-10-02 MED FILL — AMITRIPTYLINE HCL 10 MG TAB: 10 | 30 days supply | Qty: 30 | Fill #0

## 2019-10-02 MED FILL — CYCLOBENZAPRINE 5 MG TABLET: 5 | 30 days supply | Qty: 90 | Fill #0
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
## 2019-10-03 ENCOUNTER — Ambulatory Visit (INDEPENDENT_AMBULATORY_CARE_PROVIDER_SITE_OTHER): Payer: Self-pay | Admitting: Rehabilitative and Restorative Service Providers"

## 2019-10-03 ENCOUNTER — Encounter: Payer: Self-pay | Admitting: Rehabilitative and Restorative Service Providers"

## 2019-10-03 ENCOUNTER — Other Ambulatory Visit: Payer: Self-pay

## 2019-10-03 DIAGNOSIS — R2681 Unsteadiness on feet: Secondary | ICD-10-CM

## 2019-10-03 DIAGNOSIS — M25562 Pain in left knee: Secondary | ICD-10-CM

## 2019-10-03 DIAGNOSIS — M25672 Stiffness of left ankle, not elsewhere classified: Secondary | ICD-10-CM

## 2019-10-03 DIAGNOSIS — M25561 Pain in right knee: Secondary | ICD-10-CM

## 2019-10-03 DIAGNOSIS — M21372 Foot drop, left foot: Secondary | ICD-10-CM

## 2019-10-03 DIAGNOSIS — R2689 Other abnormalities of gait and mobility: Secondary | ICD-10-CM

## 2019-10-03 DIAGNOSIS — M25661 Stiffness of right knee, not elsewhere classified: Secondary | ICD-10-CM

## 2019-10-03 DIAGNOSIS — R531 Weakness: Secondary | ICD-10-CM

## 2019-10-03 DIAGNOSIS — M25662 Stiffness of left knee, not elsewhere classified: Secondary | ICD-10-CM
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
## 2019-10-03 NOTE — Telephone Encounter (Signed)
Done

## 2019-10-04 LAB — URINE CULTURE

## 2019-10-10 ENCOUNTER — Ambulatory Visit (INDEPENDENT_AMBULATORY_CARE_PROVIDER_SITE_OTHER): Payer: Self-pay | Admitting: Rehabilitative and Restorative Service Providers"

## 2019-10-10 ENCOUNTER — Other Ambulatory Visit: Payer: Self-pay

## 2019-10-10 DIAGNOSIS — R2681 Unsteadiness on feet: Secondary | ICD-10-CM

## 2019-10-10 DIAGNOSIS — M21372 Foot drop, left foot: Secondary | ICD-10-CM

## 2019-10-10 DIAGNOSIS — M25662 Stiffness of left knee, not elsewhere classified: Secondary | ICD-10-CM

## 2019-10-10 DIAGNOSIS — R531 Weakness: Secondary | ICD-10-CM

## 2019-10-10 DIAGNOSIS — R2689 Other abnormalities of gait and mobility: Secondary | ICD-10-CM

## 2019-10-10 DIAGNOSIS — M25661 Stiffness of right knee, not elsewhere classified: Secondary | ICD-10-CM

## 2019-10-10 DIAGNOSIS — M25672 Stiffness of left ankle, not elsewhere classified: Secondary | ICD-10-CM

## 2019-10-10 DIAGNOSIS — M25561 Pain in right knee: Secondary | ICD-10-CM

## 2019-10-10 DIAGNOSIS — M25562 Pain in left knee: Secondary | ICD-10-CM
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
## 2019-10-11 ENCOUNTER — Encounter: Payer: Self-pay | Admitting: Rehabilitative and Restorative Service Providers"

## 2019-10-11 ENCOUNTER — Telehealth: Payer: Self-pay | Admitting: Rehabilitative and Restorative Service Providers"

## 2019-10-11 NOTE — Telephone Encounter (Signed)
Called and left voice message with reminder of next appointment time.  Chyrel Masson, PT, DPT, OCS, ATC 10/11/19  10:33 AM

## 2019-10-14 ENCOUNTER — Other Ambulatory Visit: Payer: Self-pay

## 2019-10-14 ENCOUNTER — Encounter: Payer: Self-pay | Admitting: Rehabilitative and Restorative Service Providers"

## 2019-10-14 ENCOUNTER — Ambulatory Visit (INDEPENDENT_AMBULATORY_CARE_PROVIDER_SITE_OTHER): Payer: Self-pay | Admitting: Rehabilitative and Restorative Service Providers"

## 2019-10-14 DIAGNOSIS — M25561 Pain in right knee: Secondary | ICD-10-CM

## 2019-10-14 DIAGNOSIS — M25661 Stiffness of right knee, not elsewhere classified: Secondary | ICD-10-CM

## 2019-10-14 DIAGNOSIS — M25562 Pain in left knee: Secondary | ICD-10-CM

## 2019-10-14 DIAGNOSIS — M25662 Stiffness of left knee, not elsewhere classified: Secondary | ICD-10-CM

## 2019-10-14 DIAGNOSIS — R2681 Unsteadiness on feet: Secondary | ICD-10-CM

## 2019-10-14 DIAGNOSIS — R2689 Other abnormalities of gait and mobility: Secondary | ICD-10-CM

## 2019-10-14 DIAGNOSIS — R531 Weakness: Secondary | ICD-10-CM

## 2019-10-14 DIAGNOSIS — M21372 Foot drop, left foot: Secondary | ICD-10-CM
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
## 2019-10-16 ENCOUNTER — Encounter: Payer: Self-pay | Admitting: Rehabilitative and Restorative Service Providers"

## 2019-10-16 ENCOUNTER — Telehealth: Payer: Self-pay | Admitting: Rehabilitative and Restorative Service Providers"

## 2019-10-16 NOTE — Telephone Encounter (Signed)
Called and left message about missed appointment.  no more appointments scheduled at this time.   Chyrel Masson, PT, DPT, OCS, ATC 10/16/19  2:01 PM

## 2019-10-31 ENCOUNTER — Encounter: Payer: Self-pay | Admitting: Rehabilitative and Restorative Service Providers"

## 2019-10-31 ENCOUNTER — Other Ambulatory Visit: Payer: Self-pay

## 2019-10-31 ENCOUNTER — Ambulatory Visit (INDEPENDENT_AMBULATORY_CARE_PROVIDER_SITE_OTHER): Payer: Self-pay | Admitting: Rehabilitative and Restorative Service Providers"

## 2019-10-31 DIAGNOSIS — R531 Weakness: Secondary | ICD-10-CM

## 2019-10-31 DIAGNOSIS — M25672 Stiffness of left ankle, not elsewhere classified: Secondary | ICD-10-CM

## 2019-10-31 DIAGNOSIS — M25661 Stiffness of right knee, not elsewhere classified: Secondary | ICD-10-CM

## 2019-10-31 DIAGNOSIS — M25561 Pain in right knee: Secondary | ICD-10-CM

## 2019-10-31 DIAGNOSIS — M25562 Pain in left knee: Secondary | ICD-10-CM

## 2019-10-31 DIAGNOSIS — R2681 Unsteadiness on feet: Secondary | ICD-10-CM

## 2019-10-31 DIAGNOSIS — R2689 Other abnormalities of gait and mobility: Secondary | ICD-10-CM

## 2019-10-31 DIAGNOSIS — M21372 Foot drop, left foot: Secondary | ICD-10-CM

## 2019-10-31 DIAGNOSIS — M25662 Stiffness of left knee, not elsewhere classified: Secondary | ICD-10-CM
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
## 2019-11-01 ENCOUNTER — Encounter: Payer: Self-pay | Admitting: Rehabilitative and Restorative Service Providers"

## 2019-11-04 ENCOUNTER — Encounter: Payer: Self-pay | Admitting: Rehabilitative and Restorative Service Providers"

## 2019-11-06 ENCOUNTER — Telehealth: Payer: Self-pay | Admitting: Rehabilitative and Restorative Service Providers"

## 2019-11-06 ENCOUNTER — Encounter: Payer: Self-pay | Admitting: Rehabilitative and Restorative Service Providers"

## 2019-11-06 NOTE — Telephone Encounter (Signed)
Called Tristan White to let him know he had an appointment today at 1:45 (no show as of 1:59).  Let him know we cancelled future appointments due to too many missed appointments but we are happy to see him if he wants to call and schedule another appointment.  He was also encouraged to schedule as he needs the work.

## 2019-11-11 ENCOUNTER — Encounter: Payer: Self-pay | Admitting: Rehabilitative and Restorative Service Providers"

## 2019-11-14 ENCOUNTER — Encounter: Payer: Self-pay | Admitting: Rehabilitative and Restorative Service Providers"

## 2019-11-18 ENCOUNTER — Encounter: Payer: Self-pay | Admitting: Rehabilitative and Restorative Service Providers"

## 2019-11-22 ENCOUNTER — Encounter: Payer: Self-pay | Admitting: Rehabilitative and Restorative Service Providers"

## 2020-01-01 ENCOUNTER — Ambulatory Visit: Payer: Self-pay | Admitting: Family Medicine

## 2021-10-11 IMAGING — CT CT KNEE*L* W/O CM
3 of 6 series · 12 of 36 positions shown, 14 images · non-contrast
Comparison: Radiographs, same date.

CLINICAL DATA: Tibial plateau fracture.

EXAM:
CT OF THE left KNEE WITHOUT CONTRAST
TECHNIQUE: Multidetector CT imaging of the left knee was performed according to
the standard protocol. Multiplanar CT image reconstructions were
also generated.

[Series 3: low ext uhr 1.5 bone · axial · 0.38mm/px · z∈[-310,-154]mm · 5 of 158 slices shown, 7 images]
[im 27/158  soft-tissue]
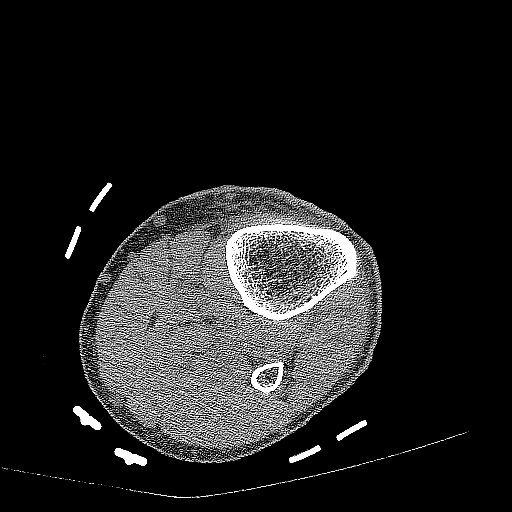
[im 27/158  bone]
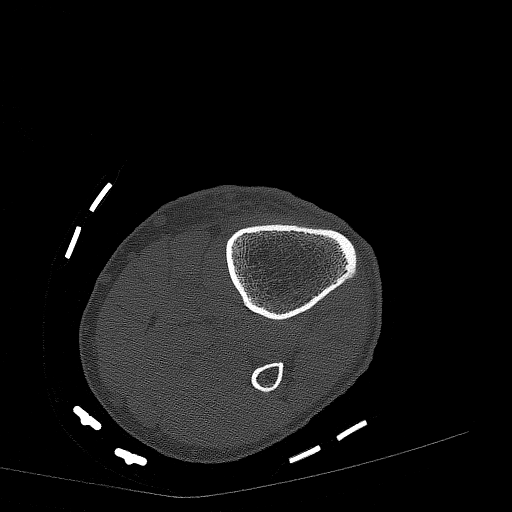
[im 53/158  bone]
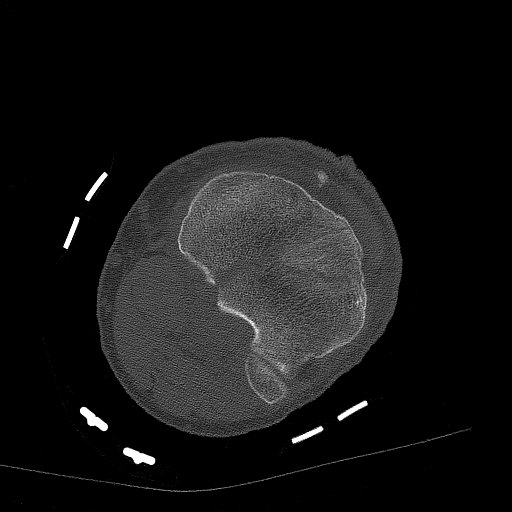
[im 79/158  bone]
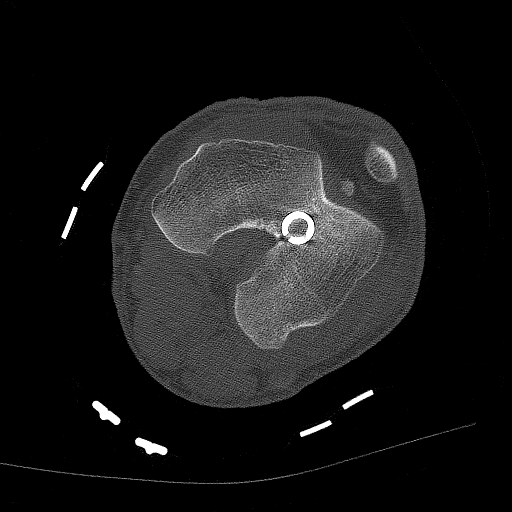
[im 105/158  bone]
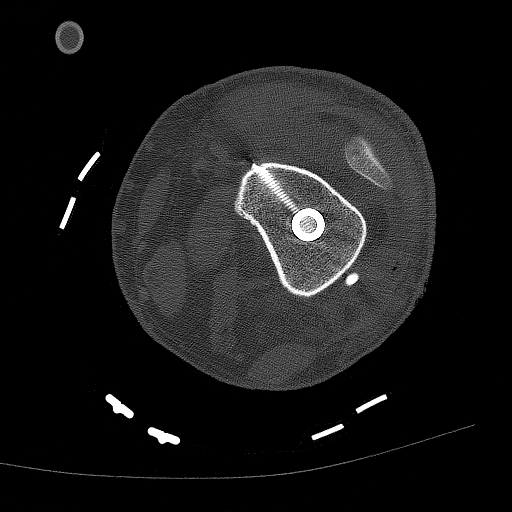
[im 131/158  soft-tissue]
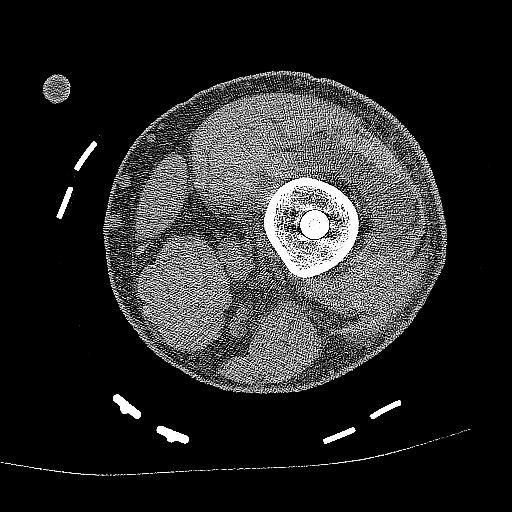
[im 131/158  bone]
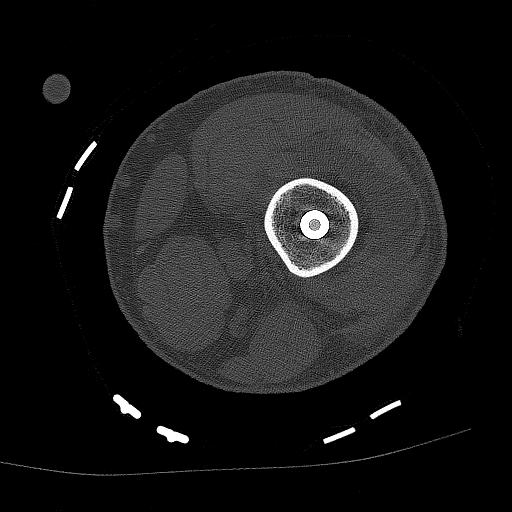

[Series 7: low ext uhr cor bone · sagittal · 0.29mm/px · 6 of 96 slices shown]
[im 16/96  bone]
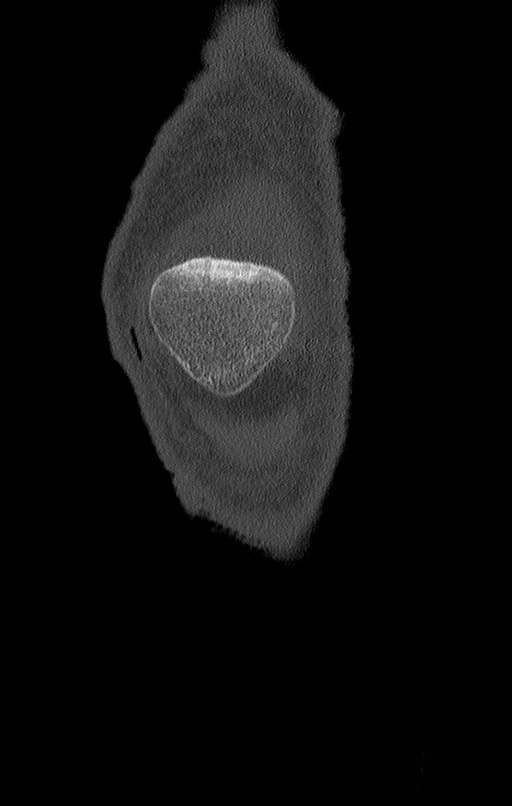
[im 28/96  soft-tissue]
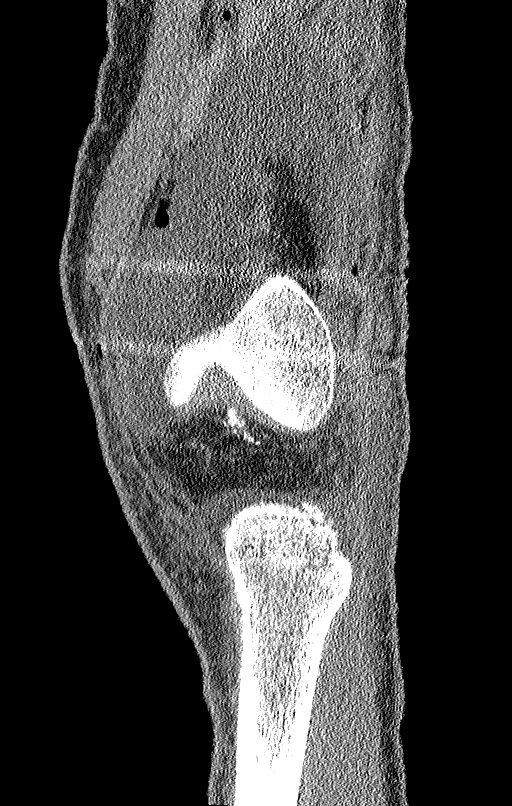
[im 32/96  bone]
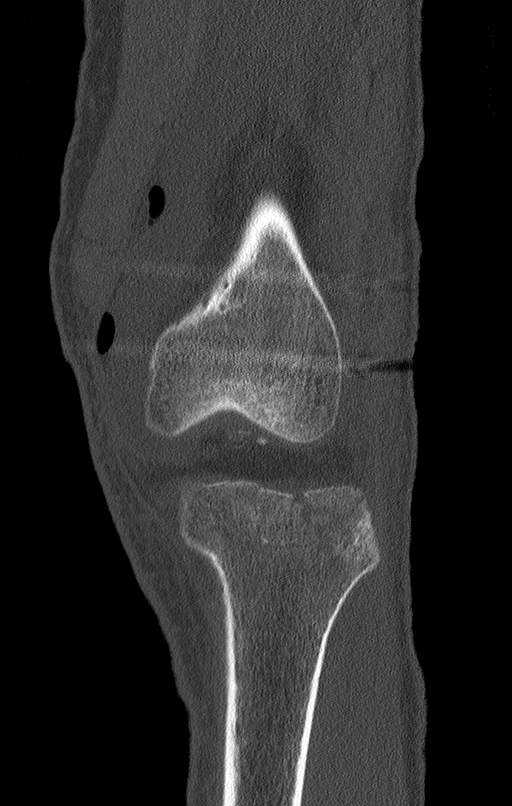
[im 48/96  bone]
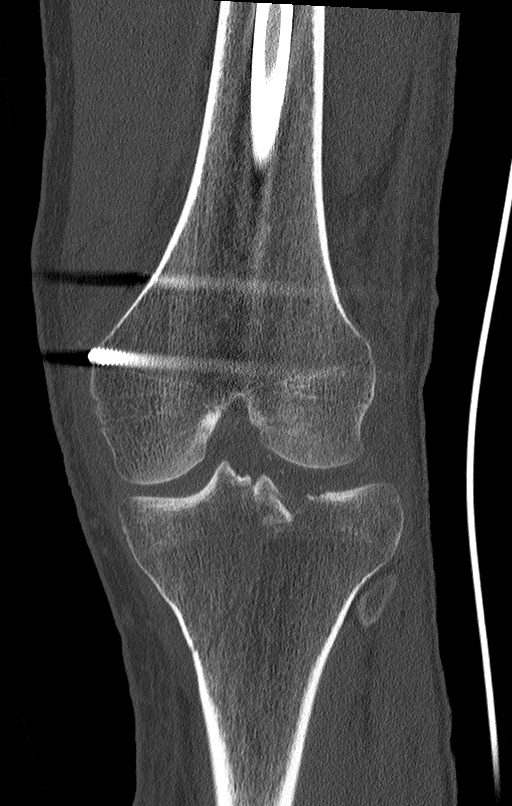
[im 64/96  bone]
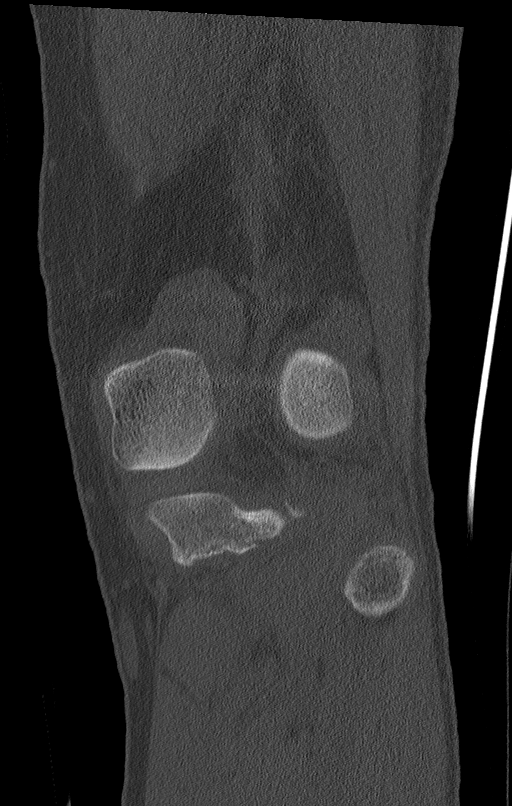
[im 80/96  bone]
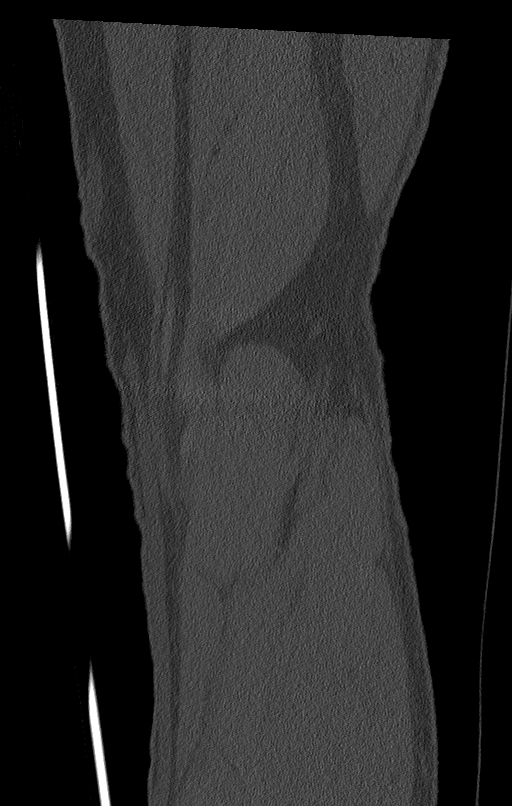

[Series 8: low ext uhr sag bone · coronal · 0.29mm/px · 1 of 101 slices shown]
[im 51/101  bone]
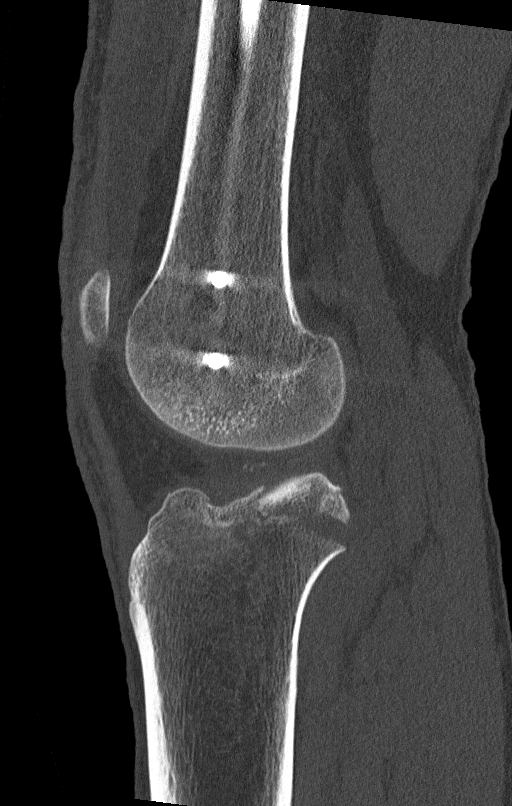

[12 of 36 positions shown; findings below may reference images not displayed]

FINDINGS: Complex comminuted tibial plateau fracture as demonstrated on the
radiographs. There is a central die punch type depression fracture
along the medial aspect of the lateral tibial plateau. Maximum
depression is approximately 6.5 mm.

Y shaped oblique coursing fracture from the central depressed area
extends out through the medial tibial cortex at the metadiaphyseal
junction region posteriorly. The medial tibial spine and the medial
tibial plateau are intact. The anterior component extends through
the anterior tibial cortex in the region of the tibial tubercle.

Intramedullary rod is noted in the femur with 2 distal interlocking
screws. No complicating features. The fibula is intact. The patella
is intact.

There is a large lipohemarthrosis and there is also some gas in the
joint. There are calcifications noted in the anterior soft tissues
and also in Hoffa's fat suggesting prior surgery or trauma. Grossly
by CT the cruciate ligaments appear intact. The quadriceps and
patellar tendons are intact. The medial collateral ligament is
grossly intact. The biceps femoris and iliotibial band are intact.
Difficult to assess the fibular collateral ligament. The popliteus
tendon appears intact.
IMPRESSION: 1. Complex comminuted tibial plateau fracture as discussed above.
2. Large lipohemarthrosis.
3. Intramedullary rod in the femur with 2 distal interlocking
screws. No complicating features.

## 2021-10-13 IMAGING — RF DG KNEE 1-2V*R*
1 series · 1 of 1 positions shown · non-contrast
Comparison: 07/14/2019

CLINICAL DATA: ORIF left tibia

EXAM:
RIGHT KNEE - 1-2 VIEW

[Series 1: run · 1 of 1 slices shown]
[im 1/1]
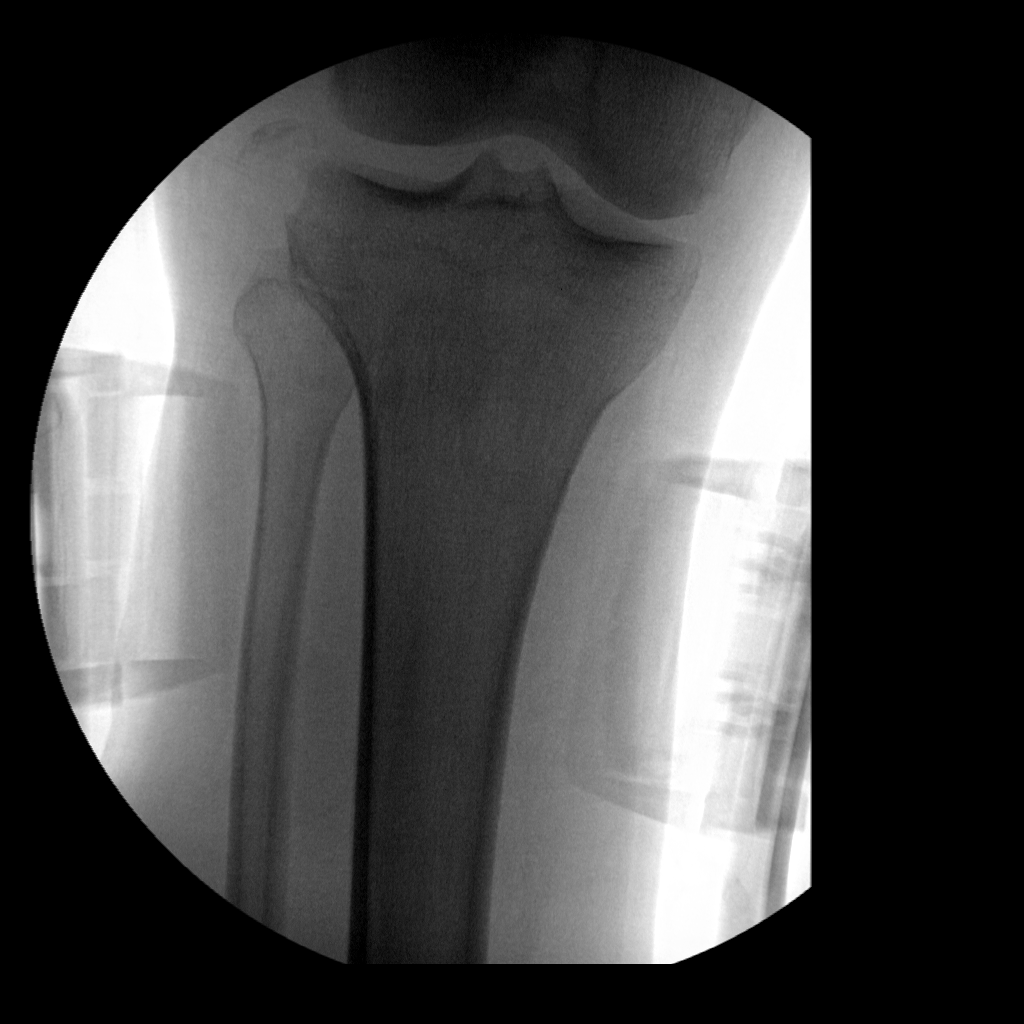

[1 of 1 positions shown; findings below may reference images not displayed]

FINDINGS: Single frontal view of the right knee obtained during left ORIF.
Total fluoroscopy time was 1 minutes 11 seconds. Avulsion fracture
fragment off the lateral aspect of the proximal tibia.
IMPRESSION: Intraoperative single spot view right knee obtained during left ORIF
for tibial fracture.

## 2022-02-18 DIAGNOSIS — Z419 Encounter for procedure for purposes other than remedying health state, unspecified: Secondary | ICD-10-CM | POA: Diagnosis not present

## 2022-03-21 DIAGNOSIS — Z419 Encounter for procedure for purposes other than remedying health state, unspecified: Secondary | ICD-10-CM | POA: Diagnosis not present

## 2022-04-21 DIAGNOSIS — Z419 Encounter for procedure for purposes other than remedying health state, unspecified: Secondary | ICD-10-CM | POA: Diagnosis not present

## 2022-05-20 DIAGNOSIS — Z419 Encounter for procedure for purposes other than remedying health state, unspecified: Secondary | ICD-10-CM | POA: Diagnosis not present

## 2022-06-20 DIAGNOSIS — Z419 Encounter for procedure for purposes other than remedying health state, unspecified: Secondary | ICD-10-CM | POA: Diagnosis not present

## 2022-07-20 DIAGNOSIS — Z419 Encounter for procedure for purposes other than remedying health state, unspecified: Secondary | ICD-10-CM | POA: Diagnosis not present

## 2022-08-20 DIAGNOSIS — Z419 Encounter for procedure for purposes other than remedying health state, unspecified: Secondary | ICD-10-CM | POA: Diagnosis not present

## 2022-09-07 DIAGNOSIS — Z7689 Persons encountering health services in other specified circumstances: Secondary | ICD-10-CM | POA: Diagnosis not present

## 2022-09-07 DIAGNOSIS — W101XXD Fall (on)(from) sidewalk curb, subsequent encounter: Secondary | ICD-10-CM | POA: Diagnosis not present

## 2022-09-07 DIAGNOSIS — X810XXA Intentional self-harm by jumping or lying in front of motor vehicle, initial encounter: Secondary | ICD-10-CM | POA: Diagnosis not present

## 2022-09-07 DIAGNOSIS — S42009D Fracture of unspecified part of unspecified clavicle, subsequent encounter for fracture with routine healing: Secondary | ICD-10-CM | POA: Diagnosis not present

## 2022-09-19 DIAGNOSIS — Z419 Encounter for procedure for purposes other than remedying health state, unspecified: Secondary | ICD-10-CM | POA: Diagnosis not present

## 2022-10-07 DIAGNOSIS — X810XXA Intentional self-harm by jumping or lying in front of motor vehicle, initial encounter: Secondary | ICD-10-CM | POA: Diagnosis not present

## 2022-10-07 DIAGNOSIS — W101XXD Fall (on)(from) sidewalk curb, subsequent encounter: Secondary | ICD-10-CM | POA: Diagnosis not present

## 2022-10-07 DIAGNOSIS — S42009D Fracture of unspecified part of unspecified clavicle, subsequent encounter for fracture with routine healing: Secondary | ICD-10-CM | POA: Diagnosis not present

## 2022-10-20 DIAGNOSIS — Z419 Encounter for procedure for purposes other than remedying health state, unspecified: Secondary | ICD-10-CM | POA: Diagnosis not present

## 2022-11-20 DIAGNOSIS — Z419 Encounter for procedure for purposes other than remedying health state, unspecified: Secondary | ICD-10-CM | POA: Diagnosis not present

## 2022-12-20 DIAGNOSIS — Z419 Encounter for procedure for purposes other than remedying health state, unspecified: Secondary | ICD-10-CM | POA: Diagnosis not present

## 2023-01-20 DIAGNOSIS — Z419 Encounter for procedure for purposes other than remedying health state, unspecified: Secondary | ICD-10-CM | POA: Diagnosis not present

## 2023-02-19 DIAGNOSIS — Z419 Encounter for procedure for purposes other than remedying health state, unspecified: Secondary | ICD-10-CM | POA: Diagnosis not present

## 2023-03-22 DIAGNOSIS — Z419 Encounter for procedure for purposes other than remedying health state, unspecified: Secondary | ICD-10-CM | POA: Diagnosis not present

## 2023-04-22 DIAGNOSIS — Z419 Encounter for procedure for purposes other than remedying health state, unspecified: Secondary | ICD-10-CM | POA: Diagnosis not present

## 2023-05-20 DIAGNOSIS — Z419 Encounter for procedure for purposes other than remedying health state, unspecified: Secondary | ICD-10-CM | POA: Diagnosis not present

## 2023-07-01 DIAGNOSIS — Z419 Encounter for procedure for purposes other than remedying health state, unspecified: Secondary | ICD-10-CM | POA: Diagnosis not present

## 2023-07-31 DIAGNOSIS — Z419 Encounter for procedure for purposes other than remedying health state, unspecified: Secondary | ICD-10-CM | POA: Diagnosis not present

## 2023-08-31 DIAGNOSIS — Z419 Encounter for procedure for purposes other than remedying health state, unspecified: Secondary | ICD-10-CM | POA: Diagnosis not present

## 2023-09-30 DIAGNOSIS — Z419 Encounter for procedure for purposes other than remedying health state, unspecified: Secondary | ICD-10-CM | POA: Diagnosis not present

## 2023-10-31 DIAGNOSIS — Z419 Encounter for procedure for purposes other than remedying health state, unspecified: Secondary | ICD-10-CM | POA: Diagnosis not present

## 2023-12-01 DIAGNOSIS — Z419 Encounter for procedure for purposes other than remedying health state, unspecified: Secondary | ICD-10-CM | POA: Diagnosis not present

## 2024-03-01 DIAGNOSIS — Z419 Encounter for procedure for purposes other than remedying health state, unspecified: Secondary | ICD-10-CM | POA: Diagnosis not present
